# Patient Record
Sex: Female | Born: 1950 | Race: White | Hispanic: No | Marital: Single | Smoking: Former smoker
Health system: Southern US, Community
[De-identification: ages and names within clinical notes are randomized; demographics above are authoritative.]

## PROBLEM LIST (undated history)

## (undated) DIAGNOSIS — Z780 Asymptomatic menopausal state: Secondary | ICD-10-CM

## (undated) DIAGNOSIS — F909 Attention-deficit hyperactivity disorder, unspecified type: Secondary | ICD-10-CM

## (undated) DIAGNOSIS — M419 Scoliosis, unspecified: Secondary | ICD-10-CM

## (undated) DIAGNOSIS — I10 Essential (primary) hypertension: Principal | ICD-10-CM

## (undated) DIAGNOSIS — R6882 Decreased libido: Secondary | ICD-10-CM

## (undated) DIAGNOSIS — F329 Major depressive disorder, single episode, unspecified: Secondary | ICD-10-CM

## (undated) DIAGNOSIS — F32A Depression, unspecified: Secondary | ICD-10-CM

## (undated) DIAGNOSIS — B351 Tinea unguium: Secondary | ICD-10-CM

## (undated) DIAGNOSIS — M1990 Unspecified osteoarthritis, unspecified site: Secondary | ICD-10-CM

## (undated) DIAGNOSIS — F419 Anxiety disorder, unspecified: Secondary | ICD-10-CM

## (undated) HISTORY — DX: Essential (primary) hypertension: I10

## (undated) HISTORY — DX: Unspecified osteoarthritis, unspecified site: M19.90

## (undated) HISTORY — DX: Scoliosis, unspecified: M41.9

## (undated) HISTORY — DX: Decreased libido: R68.82

## (undated) HISTORY — PX: OTHER SURGICAL HISTORY: SHX169

## (undated) HISTORY — DX: Major depressive disorder, single episode, unspecified: F32.9

## (undated) HISTORY — DX: Asymptomatic menopausal state: Z78.0

## (undated) HISTORY — DX: Depression, unspecified: F32.A

## (undated) HISTORY — DX: Tinea unguium: B35.1

## (undated) HISTORY — DX: Anxiety disorder, unspecified: F41.9

## (undated) HISTORY — DX: Attention-deficit hyperactivity disorder, unspecified type: F90.9

---

## 1997-12-03 ENCOUNTER — Other Ambulatory Visit: Admission: RE | Admit: 1997-12-03 | Discharge: 1997-12-03 | Payer: Self-pay | Admitting: Obstetrics and Gynecology

## 1999-01-23 ENCOUNTER — Other Ambulatory Visit: Admission: RE | Admit: 1999-01-23 | Discharge: 1999-01-23 | Payer: Self-pay | Admitting: Obstetrics and Gynecology

## 1999-02-12 ENCOUNTER — Encounter: Admission: RE | Admit: 1999-02-12 | Discharge: 1999-02-12 | Payer: Self-pay | Admitting: Obstetrics and Gynecology

## 1999-02-12 ENCOUNTER — Encounter: Payer: Self-pay | Admitting: Obstetrics and Gynecology

## 2000-02-23 ENCOUNTER — Other Ambulatory Visit: Admission: RE | Admit: 2000-02-23 | Discharge: 2000-02-23 | Payer: Self-pay | Admitting: Obstetrics and Gynecology

## 2000-03-04 ENCOUNTER — Encounter: Admission: RE | Admit: 2000-03-04 | Discharge: 2000-03-04 | Payer: Self-pay | Admitting: Obstetrics and Gynecology

## 2000-03-04 ENCOUNTER — Encounter: Payer: Self-pay | Admitting: Obstetrics and Gynecology

## 2001-03-01 ENCOUNTER — Other Ambulatory Visit: Admission: RE | Admit: 2001-03-01 | Discharge: 2001-03-01 | Payer: Self-pay | Admitting: Obstetrics and Gynecology

## 2001-03-07 ENCOUNTER — Encounter: Admission: RE | Admit: 2001-03-07 | Discharge: 2001-03-07 | Payer: Self-pay | Admitting: Obstetrics and Gynecology

## 2001-03-07 ENCOUNTER — Encounter: Payer: Self-pay | Admitting: Obstetrics and Gynecology

## 2001-03-25 ENCOUNTER — Encounter: Payer: Self-pay | Admitting: Obstetrics and Gynecology

## 2001-03-25 ENCOUNTER — Encounter: Admission: RE | Admit: 2001-03-25 | Discharge: 2001-03-25 | Payer: Self-pay | Admitting: Obstetrics and Gynecology

## 2002-03-13 ENCOUNTER — Other Ambulatory Visit: Admission: RE | Admit: 2002-03-13 | Discharge: 2002-03-13 | Payer: Self-pay | Admitting: Obstetrics and Gynecology

## 2002-03-15 ENCOUNTER — Encounter: Payer: Self-pay | Admitting: Obstetrics and Gynecology

## 2002-03-15 ENCOUNTER — Encounter: Admission: RE | Admit: 2002-03-15 | Discharge: 2002-03-15 | Payer: Self-pay | Admitting: Obstetrics and Gynecology

## 2003-04-17 ENCOUNTER — Other Ambulatory Visit: Admission: RE | Admit: 2003-04-17 | Discharge: 2003-04-17 | Payer: Self-pay | Admitting: Obstetrics and Gynecology

## 2003-07-18 ENCOUNTER — Encounter: Admission: RE | Admit: 2003-07-18 | Discharge: 2003-07-18 | Payer: Self-pay | Admitting: Obstetrics and Gynecology

## 2004-07-21 ENCOUNTER — Encounter: Admission: RE | Admit: 2004-07-21 | Discharge: 2004-07-21 | Payer: Self-pay | Admitting: Obstetrics and Gynecology

## 2005-07-24 ENCOUNTER — Encounter: Admission: RE | Admit: 2005-07-24 | Discharge: 2005-07-24 | Payer: Self-pay | Admitting: Obstetrics and Gynecology

## 2005-10-19 ENCOUNTER — Encounter: Admission: RE | Admit: 2005-10-19 | Discharge: 2005-10-19 | Payer: Self-pay | Admitting: Emergency Medicine

## 2006-07-26 ENCOUNTER — Encounter: Admission: RE | Admit: 2006-07-26 | Discharge: 2006-07-26 | Payer: Self-pay | Admitting: Emergency Medicine

## 2007-08-01 ENCOUNTER — Encounter: Admission: RE | Admit: 2007-08-01 | Discharge: 2007-08-01 | Payer: Self-pay | Admitting: Obstetrics and Gynecology

## 2008-08-14 ENCOUNTER — Encounter: Admission: RE | Admit: 2008-08-14 | Discharge: 2008-08-14 | Payer: Self-pay | Admitting: Obstetrics and Gynecology

## 2009-08-21 ENCOUNTER — Encounter: Admission: RE | Admit: 2009-08-21 | Discharge: 2009-08-21 | Payer: Self-pay | Admitting: Family Medicine

## 2010-06-12 ENCOUNTER — Institutional Professional Consult (permissible substitution) (INDEPENDENT_AMBULATORY_CARE_PROVIDER_SITE_OTHER): Payer: BC Managed Care – PPO | Admitting: Family Medicine

## 2010-06-12 DIAGNOSIS — Z79899 Other long term (current) drug therapy: Secondary | ICD-10-CM

## 2010-06-12 DIAGNOSIS — R109 Unspecified abdominal pain: Secondary | ICD-10-CM

## 2010-06-12 DIAGNOSIS — L219 Seborrheic dermatitis, unspecified: Secondary | ICD-10-CM

## 2010-06-12 DIAGNOSIS — B351 Tinea unguium: Secondary | ICD-10-CM

## 2010-07-17 ENCOUNTER — Ambulatory Visit (INDEPENDENT_AMBULATORY_CARE_PROVIDER_SITE_OTHER): Payer: BC Managed Care – PPO

## 2010-07-17 DIAGNOSIS — Z79899 Other long term (current) drug therapy: Secondary | ICD-10-CM

## 2010-08-05 ENCOUNTER — Other Ambulatory Visit: Payer: Self-pay | Admitting: Family Medicine

## 2010-08-05 DIAGNOSIS — Z1231 Encounter for screening mammogram for malignant neoplasm of breast: Secondary | ICD-10-CM

## 2010-08-25 ENCOUNTER — Ambulatory Visit
Admission: RE | Admit: 2010-08-25 | Discharge: 2010-08-25 | Disposition: A | Discharge status: Home / Self Care | Payer: BC Managed Care – PPO | Source: Ambulatory Visit | Attending: Family Medicine | Admitting: Family Medicine

## 2010-08-25 DIAGNOSIS — Z1231 Encounter for screening mammogram for malignant neoplasm of breast: Secondary | ICD-10-CM

## 2010-08-26 ENCOUNTER — Other Ambulatory Visit: Payer: BC Managed Care – PPO

## 2010-08-27 ENCOUNTER — Other Ambulatory Visit: Payer: BC Managed Care – PPO

## 2010-08-27 DIAGNOSIS — Z79899 Other long term (current) drug therapy: Secondary | ICD-10-CM

## 2010-08-27 LAB — HEPATIC FUNCTION PANEL
ALT: 14 U/L (ref 0–35)
Bilirubin, Direct: 0.1 mg/dL (ref 0.0–0.3)
Total Bilirubin: 0.7 mg/dL (ref 0.3–1.2)
Total Protein: 6.3 g/dL (ref 6.0–8.3)

## 2010-08-29 ENCOUNTER — Encounter: Payer: Self-pay | Admitting: Family Medicine

## 2010-09-06 ENCOUNTER — Other Ambulatory Visit: Payer: Self-pay | Admitting: Family Medicine

## 2010-09-08 ENCOUNTER — Telehealth: Payer: Self-pay | Admitting: *Deleted

## 2010-09-08 NOTE — Telephone Encounter (Signed)
Left message on pt's vm informing her that DrKnapp did not refill her Lamisil as treatment was only for 3 months.

## 2010-09-08 NOTE — Telephone Encounter (Signed)
Please review and refill is needed.

## 2010-09-10 ENCOUNTER — Telehealth: Payer: Self-pay | Admitting: *Deleted

## 2010-09-10 NOTE — Telephone Encounter (Signed)
You can advise the patient that the medication stays in the system (nailbed) longer than for just the time that she takes it.  We can discuss further at her CPE

## 2010-09-10 NOTE — Telephone Encounter (Signed)
Pt advised that medication stays in system longer than just while taking. Will discuss at CPE 09/25/10.

## 2010-09-10 NOTE — Telephone Encounter (Signed)
Patient was returning my phone call from the other day [id number]. Requested refill, I informed her that the 3 month therapy course was completed and there was no reason for refill. Patient called me wanting to to know if she could do another course possibly course. Her new growth appears normal, but she feels that she is only 50% better. She has a CPE sch for 09/25/10.

## 2010-09-18 ENCOUNTER — Encounter: Payer: Self-pay | Admitting: *Deleted

## 2010-09-25 ENCOUNTER — Ambulatory Visit (INDEPENDENT_AMBULATORY_CARE_PROVIDER_SITE_OTHER): Payer: BC Managed Care – PPO | Admitting: Family Medicine

## 2010-09-25 ENCOUNTER — Encounter: Payer: Self-pay | Admitting: Family Medicine

## 2010-09-25 VITALS — BP 142/92 | HR 88 | Ht 65.0 in | Wt 120.5 lb

## 2010-09-25 DIAGNOSIS — Z23 Encounter for immunization: Secondary | ICD-10-CM

## 2010-09-25 DIAGNOSIS — B351 Tinea unguium: Secondary | ICD-10-CM

## 2010-09-25 DIAGNOSIS — R5383 Other fatigue: Secondary | ICD-10-CM

## 2010-09-25 DIAGNOSIS — F411 Generalized anxiety disorder: Secondary | ICD-10-CM

## 2010-09-25 DIAGNOSIS — Z0000 Encounter for general adult medical examination without abnormal findings: Secondary | ICD-10-CM

## 2010-09-25 LAB — CBC WITH DIFFERENTIAL/PLATELET
Basophils Relative: 1 % (ref 0–1)
Eosinophils Absolute: 0.2 10*3/uL (ref 0.0–0.7)
Eosinophils Relative: 4 % (ref 0–5)
HCT: 44 % (ref 36.0–46.0)
Hemoglobin: 15 g/dL (ref 12.0–15.0)
Lymphs Abs: 1.2 10*3/uL (ref 0.7–4.0)
MCH: 33.5 pg (ref 26.0–34.0)
MCHC: 34.1 g/dL (ref 30.0–36.0)
Monocytes Absolute: 0.4 10*3/uL (ref 0.1–1.0)
Monocytes Relative: 10 % (ref 3–12)
Neutro Abs: 2.5 10*3/uL (ref 1.7–7.7)
RDW: 13.1 % (ref 11.5–15.5)

## 2010-09-25 LAB — POCT URINALYSIS DIPSTICK
Ketones, UA: NEGATIVE
Spec Grav, UA: 1.005
pH, UA: 6

## 2010-09-25 LAB — TSH: TSH: 0.865 u[IU]/mL (ref 0.350–4.500)

## 2010-09-25 NOTE — Progress Notes (Signed)
Subjective:    Patient ID: Olivia Harris, female    DOB: [birth year], 60 y.o.   MRN: [id number]  HPI Olivia Harris is a 60 y.o. female who presents for a complete physical. She sees a GYN for her female exams.  She has the following list of concerns: Thinks she needs more Lamisil. She checks her BP's every morning.  The last few days they've been 109-114/70 in both arms.  Has noticed sometimes the BP is higher in her right arm (never higher in the left, but sometimes <10 points different) Pain R anterior chest while in [employer], after working with trainer just prior.  Has had similar symptom after riding bike yesterday (in the 90 degrees) Complains of fatigue, wakes up tired.  Seems to be getting better.  Not known to snore/apnea Lump on palm of R hand by her 4th finger.  No triggering of finger.  Slightly tender to press on, otherwise symptomatic Vaginal sore where she was putting testosterone  There is no immunization history on file for this patient. Last tetanus 4 years ago per pt Last Pap smear: 04/2010 Last mammogram: 07/2010 Last colonoscopy: Never. Has a list of providers that will do procedure for just copay rather than large cost Last DEXA: through GYN, osteopenia per pt Pt states that Vitamin D levels were normal a year or 2 ago Ophtho: regular Dentist: regular Works with trainer 2x/week for 25 minutes  Past Medical History  Diagnosis Date  . ADHD (attention deficit hyperactivity disorder) Dr.Steiner  . Depression Dr.Steiner  . Anxiety Dr.Steiner  . Postmenopausal   . Decreased libido   . Scoliosis   . Onychomycosis   . Seborrheic dermatitis     Past Surgical History  Procedure Date  . Arthroscopic surgery shoulder right, 1994(Dr.Rowan) bone spur removed    History   Social History  . Marital Status: Single    Spouse Name: N/A    Number of Children: N/A  . Years of Education: N/A   Occupational History  . Not on file.   Social History Main Topics   . Smoking status: Current Everyday Smoker -- 0.2 packs/day  . Smokeless tobacco: Not on file  . Alcohol Use: Yes     1-2 drinks per day  . Drug Use: No  . Sexually Active: Not on file   Other Topics Concern  . Not on file   Social History Narrative  . No narrative on file   Family History  Problem Relation Age of Onset  . Heart disease Mother     CHF  . Thyroid disease Mother   . Hypertension Brother    Current outpatient prescriptions:ALPRAZolam (XANAX XR) 2 MG 24 hr tablet, Take 2 mg by mouth every morning.  , Disp: , Rfl: ;  amphetamine-dextroamphetamine (ADDERALL) 30 MG tablet, Take 30 mg by mouth daily.  , Disp: , Rfl: ;  estradiol (VIVELLE-DOT) 0.025 MG/24HR, Place 1 patch onto the skin 2 (two) times a week.  , Disp: , Rfl: ;  progesterone (PROMETRIUM) 100 MG capsule, Take 100 mg by mouth at bedtime.  , Disp: , Rfl:  Testosterone Propionate 2 % OINT, Place 0.5 mLs onto the skin daily.  , Disp: , Rfl: ;  traZODone (DESYREL) 100 MG tablet, Take 50-125 mg by mouth at bedtime.  , Disp: , Rfl: ;  sildenafil (VIAGRA) 25 MG tablet, Take 25 mg by mouth daily as needed.  , Disp: , Rfl:   Allergies  Allergen Reactions  .  Antihistamines, Chlorpheniramine-Type Other (See Comments)    hyperactivity   Review of Systems The patient denies anorexia, fever, weight changes, headaches,  vision changes, decreased hearing, ear pain, sore throat, breast concerns, palpitations, dizziness, syncope, dyspnea on exertion, cough, swelling, nausea, vomiting, diarrhea, constipation, melena, hematochezia, indigestion/heartburn, hematuria, incontinence, dysuria, postmenopausal bleeding, vaginal discharge, odor or itch, joint pains, numbness, tingling, weakness, tremor, suspicious skin lesions,   Occasional abdominal pain in the mornings.  Homeopathic medications help.  Started Acidophilus and "quick digest" and doing better.  See HPI for rest of complaints    Objective:   Physical Exam BP 142/92  Pulse  88  Ht 5\' 5"  (1.651 m)  Wt 120 lb 8 oz (54.658 kg)  BMI 20.05 kg/m2  General Appearance:    Alert, cooperative, no distress, appears stated age  Head:    Normocephalic, without obvious abnormality, atraumatic  Eyes:    PERRL, conjunctiva/corneas clear, EOM's intact, fundi    benign  Ears:    Normal TM's and external ear canals  Nose:   Nares normal, mucosa normal, no drainage or sinus   tenderness  Throat:   Lips, mucosa, and tongue normal; teeth and gums normal  Neck:   Supple, no lymphadenopathy;  thyroid:  no   enlargement/tenderness/nodules; no carotid   bruit or JVD  Back:    Spine nontender,+scoliosis, ROM normal, no CVA     tenderness  Lungs:     Clear to auscultation bilaterally without wheezes, rales or     ronchi; respirations unlabored  Chest Wall:    No tenderness or deformity   Heart:    Regular rate and rhythm, S1 and S2 normal, no murmur, rub   or gallop  Breast Exam:    Deferred to GYN  Abdomen:     Soft, non-tender, nondistended, normoactive bowel sounds,    no masses, no hepatosplenomegaly  Genitalia:   Full exam deferred to GYN.  Small cluster of 5 pustules, erythematous at R labia minora     Extremities:   No clubbing, cyanosis or edema.  Nodule palpable palmar aspect R hand, along 4th flexor tendon, nontender.  There is 3mm of normal nail growth at base of R great toenail  Pulses:   2+ and symmetric all extremities  Skin:   Skin color, texture, turgor normal, no rashes or lesions  Lymph nodes:   Cervical, supraclavicular, and axillary nodes normal  Neurologic:   CNII-XII intact, normal strength, sensation and gait; reflexes 2+ and symmetric throughout          Psych:   Normal hygiene and grooming.  Flat affect, anxious, mildly obsessing over things such as her BP's, and the other minor complaints.  Brought in many lists       Assessment & Plan:   1. Routine general medical examination at a health care facility  POCT urinalysis dipstick, Visual acuity screening,  Lipid panel  2. Need for pneumococcal vaccination  Pneumococcal polysaccharide vaccine 23-valent greater than or equal to 2yo subcutaneous/IM  3. Fatigue  CBC with Differential, TSH, Comprehensive metabolic panel   Onychomycosis-- reassured that nail is growing in normally, and will continue to do so.  May take a year for entire great toe nail to appear normal.  Repeat Lamisil course is not needed at this time.  Labial lesion--possibly related to shaving, vs topical testosterone use.  Recommend warm soaks, antibacterial ointment, no shaving, and f/u with GYN if not healing.  R flexor tendon nodule (without any triggering of finger)  vs ganglion cyst. Asymptomatic, therefore no treatment recommended.  She will let me know if she becomes symptomatic for referral to hand surgeon.  Reassured that her BP's are the same in both arms.  BP elevated here today, likely related to her anxiety.  BP's at home are normal.  Fatigue--check CBC, c-met, lipid, tsh  Pneumovax given due to her being a smoker.  Counseled regarding smoking cessation. Check insurance/cost of Zostavax--advised that it is recommended at age 74 Schedule colonoscopy (she will call us if referral is needed, but she wants to do herself)  Discussed monthly self breast exams and yearly mammograms after the age of 8; at least 30 minutes of aerobic activity at least 5 days/week; proper sunscreen use reviewed; healthy diet, including goals of calcium and vitamin D intake and alcohol recommendations (less than or equal to 1 drink/day) reviewed; regular seatbelt use; changing batteries in smoke detectors.  Immunization recommendations discussed (see above).  Colonoscopy recommendations reviewed--encouraged to schedule now

## 2010-09-26 ENCOUNTER — Encounter: Payer: Self-pay | Admitting: Family Medicine

## 2010-09-26 LAB — LIPID PANEL
Cholesterol: 199 mg/dL (ref 0–200)
HDL: 84 mg/dL (ref >39)
Total CHOL/HDL Ratio: 2.4 Ratio
Triglycerides: 63 mg/dL (ref <150)

## 2010-09-26 LAB — COMPREHENSIVE METABOLIC PANEL
ALT: 13 U/L (ref 0–35)
AST: 18 U/L (ref 0–37)
Albumin: 4.2 g/dL (ref 3.5–5.2)
Alkaline Phosphatase: 70 U/L (ref 39–117)
Creat: 0.55 mg/dL (ref 0.50–1.10)
Total Bilirubin: 0.8 mg/dL (ref 0.3–1.2)
Total Protein: 6.3 g/dL (ref 6.0–8.3)

## 2010-12-31 ENCOUNTER — Ambulatory Visit: Payer: BC Managed Care – PPO | Admitting: Family Medicine

## 2011-08-04 ENCOUNTER — Other Ambulatory Visit: Payer: Self-pay | Admitting: Obstetrics & Gynecology

## 2011-08-04 DIAGNOSIS — Z1231 Encounter for screening mammogram for malignant neoplasm of breast: Secondary | ICD-10-CM

## 2011-08-26 ENCOUNTER — Ambulatory Visit
Admission: RE | Admit: 2011-08-26 | Discharge: 2011-08-26 | Disposition: A | Discharge status: Home / Self Care | Payer: BC Managed Care – PPO | Source: Ambulatory Visit | Attending: Obstetrics & Gynecology | Admitting: Obstetrics & Gynecology

## 2011-08-26 DIAGNOSIS — Z1231 Encounter for screening mammogram for malignant neoplasm of breast: Secondary | ICD-10-CM

## 2012-02-04 ENCOUNTER — Encounter: Payer: Self-pay | Admitting: Family Medicine

## 2012-02-04 ENCOUNTER — Ambulatory Visit (INDEPENDENT_AMBULATORY_CARE_PROVIDER_SITE_OTHER): Payer: BC Managed Care – PPO | Admitting: Family Medicine

## 2012-02-04 VITALS — BP 142/90 | HR 118 | Temp 99.3°F | Ht 64.75 in | Wt 135.0 lb

## 2012-02-04 DIAGNOSIS — M899 Disorder of bone, unspecified: Secondary | ICD-10-CM

## 2012-02-04 DIAGNOSIS — M8580 Other specified disorders of bone density and structure, unspecified site: Secondary | ICD-10-CM

## 2012-02-04 DIAGNOSIS — R030 Elevated blood-pressure reading, without diagnosis of hypertension: Secondary | ICD-10-CM

## 2012-02-04 DIAGNOSIS — Z131 Encounter for screening for diabetes mellitus: Secondary | ICD-10-CM

## 2012-02-04 DIAGNOSIS — Z13220 Encounter for screening for lipoid disorders: Secondary | ICD-10-CM

## 2012-02-04 DIAGNOSIS — IMO0002 Reserved for concepts with insufficient information to code with codable children: Secondary | ICD-10-CM

## 2012-02-04 MED ORDER — CEPHALEXIN 500 MG PO TABS: 500.0000 mg | ORAL_TABLET | Freq: Three times a day (TID) | ORAL | Status: DC

## 2012-02-04 NOTE — Patient Instructions (Addendum)
-We have ordered labs or studies at this visit. It can take up to 1-2 weeks for results and processing. We will contact you with instructions IF your results are abnormal. Normal results will be released to your [facility]. If you have not heard from [place] or can not find your results in [facility] in 2 weeks please contact our office.  -PLEASE SIGN UP FOR MYCHART TODAY   We recommend the following healthy lifestyle measures: - eat a healthy diet consisting of lots of vegetables, fruits, beans, nuts, seeds, healthy meats such as white chicken and fish and whole grains.  - avoid fried foods, fast food, processed foods, sodas, red meet and other fattening foods.  - get a least 150 minutes of aerobic exercise per week.   Follow up in: 2 weeks for nail issues - will get clippings of nails at that time  Please keep log of blood pressures.  Paronychia Paronychia is an inflammatory reaction involving the folds of the skin surrounding the fingernail. This is commonly caused by an infection in the skin around a nail. The most common cause of paronychia is frequent wetting of the hands (as seen with bartenders, food servers, nurses or others who wet their hands). This makes the skin around the fingernail susceptible to infection by bacteria (germs) or fungus. Other predisposing factors are:  Aggressive manicuring.  Nail biting.  Thumb sucking. The most common cause is a staphylococcal (a type of germ) infection, or a fungal (Candida) infection. When caused by a germ, it usually comes on suddenly with redness, swelling, pus and is often painful. It may get under the nail and form an abscess (collection of pus), or form an abscess around the nail. If the nail itself is infected with a fungus, the treatment is usually prolonged and may require oral medicine for up to one year. Your caregiver will determine the length of time treatment is required. The paronychia caused by bacteria (germs) may largely be avoided by  not pulling on hangnails or picking at cuticles. When the infection occurs at the tips of the finger it is called felon. When the cause of paronychia is from the herpes simplex virus (HSV) it is called herpetic whitlow. TREATMENT  When an abscess is present treatment is often incision and drainage. This means that the abscess must be cut open so the pus can get out. When this is done, the following home care instructions should be followed. HOME CARE INSTRUCTIONS   It is important to keep the affected fingers very dry. Rubber or plastic gloves over cotton gloves should be used whenever the hand must be placed in water.  Keep wound clean, dry and dressed as suggested by your caregiver between warm soaks or warm compresses.  Soak in warm salt water for ten to fifteen minutes 2-3 times per day for bacterial infections. Fungal infections are very difficult to treat, so often require treatment for long periods of time.  For bacterial (germ) infections take antibiotics (medicine which kill germs) as directed and finish the prescription, even if the problem appears to be solved before the medicine is gone.  Only take over-the-counter or prescription medicines for pain, discomfort, or fever as directed by your caregiver. SEEK IMMEDIATE MEDICAL CARE IF:  You have redness, swelling, or increasing pain in the wound.  You notice pus coming from the wound.  You have a fever.  You notice a bad smell coming from the wound or dressing. Document Released: 09/16/2000 Document Revised: 06/15/2011 Document Reviewed:  05/18/2008 [employer] Patient Information 2013 [place], [place].

## 2012-02-04 NOTE — Progress Notes (Signed)
Chief Complaint  Patient presents with  . Establish Care    HPI: Olivia Harris is here to establish care.   Has the following concerns today:  Nail infection: -R 4th digit -red and swollen area around nail and nail rough for a long time -sore at times -also has chronic nail problem on R great toe  Elevated BP today: -hx of elevated BP in the past at doctor visits -saw cards and BP always normal there and stress test normal -told did not need medication -checks at home and always normal  Other Providers: -gyn: [facility], Dr. Juliene Pina (follows her for osteopenia, paps, breast exams, mammos), hx osteopenia -psych: Dr. Madaline Guthrie - for anxiety, depression, ADHD  Refused flu.   ROS: See pertinent positives and negatives per HPI.  Past Medical History  Diagnosis Date  . ADHD (attention deficit hyperactivity disorder) Dr.Steiner  . Depression Dr.Steiner  . Anxiety Dr.Steiner  . Postmenopausal   . Decreased libido   . Scoliosis   . Onychomycosis   . Seborrheic dermatitis     Family History  Problem Relation Age of Onset  . Heart disease Mother     CHF  . Thyroid disease Mother   . Hypertension Brother     History   Social History  . Marital Status: Single    Spouse Name: N/A    Number of Children: N/A  . Years of Education: N/A   Social History Main Topics  . Smoking status: Current Every Day Smoker -- 0.2 packs/day  . Smokeless tobacco: None  . Alcohol Use: Yes     1-2 drinks per day  . Drug Use: No  . Sexually Active: None   Other Topics Concern  . None   Social History Narrative  . None    Current outpatient prescriptions:ALPRAZolam (XANAX XR) 2 MG 24 hr tablet, Take 2 mg by mouth every morning.  , Disp: , Rfl: ;  amphetamine-dextroamphetamine (ADDERALL) 30 MG tablet, Take 30 mg by mouth daily.  , Disp: , Rfl: ;  meloxicam (MOBIC) 15 MG tablet, Take 15 mg by mouth daily., Disp: , Rfl: ;  traZODone (DESYREL) 100 MG tablet, Take 100 mg by mouth at  bedtime. , Disp: , Rfl:  Cephalexin 500 MG tablet, Take 1 tablet (500 mg total) by mouth 3 (three) times daily., Disp: 30 tablet, Rfl: 0  EXAM:  Filed Vitals:   02/04/12 1654  BP: 142/90  Pulse:   Temp:     Body mass index is 22.64 kg/(m^2).  GENERAL: vitals reviewed and listed above, alert, oriented, appears well hydrated and in no acute distress  HEENT: atraumatic, conjunttiva clear, no obvious abnormalities on inspection of external nose and ears  NECK: no obvious masses on inspection  LUNGS: clear to auscultation bilaterally, no wheezes, rales or rhonchi, good air movement  CV: HRRR, no peripheral edema  SKIN: R great tow with thinning and fraying of distal nail, R 4th digit with erythema around nail - no abscess, some chipping nad cracking of nail.  MS: moves all extremities without noticeable abnormality  PSYCH: pleasant and cooperative, no obvious depression or anxiety  ASSESSMENT AND PLAN:  Discussed the following assessment and plan:  1. Elevated blood pressure    2. Screening for diabetes mellitus  Hemoglobin A1c  3. Nail bed infection  CMP  4. Paronychia    5. Screening for hypercholesterolemia  Lipid Panel  6. Osteopenia  Vitamin D, 25-hydroxy   -will tx paronychia with abx and soaks,  will follow up in 2 weeks and get nail clippings and send for fungal culture -will recheck BP and follow up home BP log next appointment -We reviewed the PMH, PSH, FH, SH, Meds and Allergies. -We provided refills for any medications we will prescribe as needed. -We addressed current concerns per orders and patient instructions. -We have asked for records for pertinent exams, studies, vaccines and notes from previous providers. -We have advised patient to follow up per instructions below.  -Patient advised to return or notify a doctor immediately if symptoms worsen or persist or new concerns arise.  Patient Instructions  -We have ordered labs or studies at this visit. It  can take up to 1-2 weeks for results and processing. We will contact you with instructions IF your results are abnormal. Normal results will be released to your [facility]. If you have not heard from [place] or can not find your results in [facility] in 2 weeks please contact our office.  -PLEASE SIGN UP FOR MYCHART TODAY   We recommend the following healthy lifestyle measures: - eat a healthy diet consisting of lots of vegetables, fruits, beans, nuts, seeds, healthy meats such as white chicken and fish and whole grains.  - avoid fried foods, fast food, processed foods, sodas, red meet and other fattening foods.  - get a least 150 minutes of aerobic exercise per week.   Follow up in: 2 weeks for nail issues - will get clippings of nails at that time  Please keep log of blood pressures.  Paronychia Paronychia is an inflammatory reaction involving the folds of the skin surrounding the fingernail. This is commonly caused by an infection in the skin around a nail. The most common cause of paronychia is frequent wetting of the hands (as seen with bartenders, food servers, nurses or others who wet their hands). This makes the skin around the fingernail susceptible to infection by bacteria (germs) or fungus. Other predisposing factors are:  Aggressive manicuring.  Nail biting.  Thumb sucking. The most common cause is a staphylococcal (a type of germ) infection, or a fungal (Candida) infection. When caused by a germ, it usually comes on suddenly with redness, swelling, pus and is often painful. It may get under the nail and form an abscess (collection of pus), or form an abscess around the nail. If the nail itself is infected with a fungus, the treatment is usually prolonged and may require oral medicine for up to one year. Your caregiver will determine the length of time treatment is required. The paronychia caused by bacteria (germs) may largely be avoided by not pulling on hangnails or picking at cuticles. When  the infection occurs at the tips of the finger it is called felon. When the cause of paronychia is from the herpes simplex virus (HSV) it is called herpetic whitlow. TREATMENT  When an abscess is present treatment is often incision and drainage. This means that the abscess must be cut open so the pus can get out. When this is done, the following home care instructions should be followed. HOME CARE INSTRUCTIONS   It is important to keep the affected fingers very dry. Rubber or plastic gloves over cotton gloves should be used whenever the hand must be placed in water.  Keep wound clean, dry and dressed as suggested by your caregiver between warm soaks or warm compresses.  Soak in warm salt water for ten to fifteen minutes 2-3 times per day for bacterial infections. Fungal infections are very difficult to treat,  so often require treatment for long periods of time.  For bacterial (germ) infections take antibiotics (medicine which kill germs) as directed and finish the prescription, even if the problem appears to be solved before the medicine is gone.  Only take over-the-counter or prescription medicines for pain, discomfort, or fever as directed by your caregiver. SEEK IMMEDIATE MEDICAL CARE IF:  You have redness, swelling, or increasing pain in the wound.  You notice pus coming from the wound.  You have a fever.  You notice a bad smell coming from the wound or dressing. Document Released: 09/16/2000 Document Revised: 06/15/2011 Document Reviewed: 05/18/2008 [facility] Patient Information 2013 [place], [place], [place].

## 2012-02-05 ENCOUNTER — Telehealth: Payer: Self-pay | Admitting: Family Medicine

## 2012-02-05 LAB — COMPREHENSIVE METABOLIC PANEL
ALT: 21 U/L (ref 0–35)
AST: 23 U/L (ref 0–37)
Albumin: 4.5 g/dL (ref 3.5–5.2)
Alkaline Phosphatase: 73 U/L (ref 39–117)
Calcium: 9.3 mg/dL (ref 8.4–10.5)
Creatinine, Ser: 0.5 mg/dL (ref 0.4–1.2)
Potassium: 3.9 mEq/L (ref 3.5–5.1)
Total Bilirubin: 0.6 mg/dL (ref 0.3–1.2)
Total Protein: 7 g/dL (ref 6.0–8.3)

## 2012-02-05 LAB — VITAMIN D 25 HYDROXY (VIT D DEFICIENCY, FRACTURES): Vit D, 25-Hydroxy: 54 ng/mL (ref 30–89)

## 2012-02-05 LAB — LIPID PANEL
Cholesterol: 216 mg/dL — ABNORMAL HIGH (ref 0–200)
Total CHOL/HDL Ratio: 2

## 2012-02-05 NOTE — Telephone Encounter (Signed)
Labs look good. Cholesterol a little high, but good cholesterol high. Advised regular exercise and healthy diet.

## 2012-02-05 NOTE — Telephone Encounter (Signed)
Called and spoke with pt and pt is aware.  

## 2012-02-23 ENCOUNTER — Ambulatory Visit (INDEPENDENT_AMBULATORY_CARE_PROVIDER_SITE_OTHER): Payer: BC Managed Care – PPO | Admitting: Family Medicine

## 2012-02-23 ENCOUNTER — Encounter: Payer: Self-pay | Admitting: Family Medicine

## 2012-02-23 VITALS — BP 152/90 | HR 115 | Temp 99.1°F | Wt 135.0 lb

## 2012-02-23 DIAGNOSIS — I10 Essential (primary) hypertension: Secondary | ICD-10-CM

## 2012-02-23 DIAGNOSIS — L609 Nail disorder, unspecified: Secondary | ICD-10-CM

## 2012-02-23 MED ORDER — LISINOPRIL 5 MG PO TABS: 5.0000 mg | ORAL_TABLET | Freq: Every day | ORAL | Status: DC

## 2012-02-23 NOTE — Progress Notes (Signed)
Chief Complaint  Patient presents with  . Follow-up    HPI:  Follow up:  Elevated Blood Pressure: -elevated last appt, but pt reported always normal at home and white coat HTN -patient brought home BP log today -FH hypertension -exercises and tries to eat right  Nail Problems: -R 4th digit hand and R great toe -Paronychia R hand tx with abx and soaks last visit -Plan to get nail clipping this visit to see if this is a fungus  Smoking: -quit regular cigarettes -using e-cigs  ROS: See pertinent positives and negatives per HPI.  Past Medical History  Diagnosis Date  . ADHD (attention deficit hyperactivity disorder) Dr.Steiner  . Depression Dr.Steiner  . Anxiety Dr.Steiner  . Postmenopausal   . Decreased libido   . Scoliosis   . Onychomycosis   . Seborrheic dermatitis   . Arthritis   . Scoliosis     degenerative     Family History  Problem Relation Age of Onset  . Heart disease Mother     CHF  . Thyroid disease Mother   . Hypertension Brother   . Arthritis Mother   . Arthritis Sister   . Arthritis      grandmother  . Hyperlipidemia Sister   . Heart disease Mother   . Heart disease      grandmother  . Stroke      grandfather  . Hypertension Brother     History   Social History  . Marital Status: Single    Spouse Name: N/A    Number of Children: N/A  . Years of Education: N/A   Social History Main Topics  . Smoking status: Former Smoker -- 0.2 packs/day  . Smokeless tobacco: None  . Alcohol Use: Yes     Comment: 1-2 drinks per day  . Drug Use: No  . Sexually Active: None   Other Topics Concern  . None   Social History Narrative  . None    Current outpatient prescriptions:ALPRAZolam (XANAX XR) 2 MG 24 hr tablet, Take 2 mg by mouth every morning.  , Disp: , Rfl: ;  amphetamine-dextroamphetamine (ADDERALL) 30 MG tablet, Take 30 mg by mouth daily.  , Disp: , Rfl: ;  meloxicam (MOBIC) 15 MG tablet, Take 15 mg by mouth daily., Disp: , Rfl: ;   traZODone (DESYREL) 100 MG tablet, Take 100 mg by mouth at bedtime. , Disp: , Rfl:  Cephalexin 500 MG tablet, Take 1 tablet (500 mg total) by mouth 3 (three) times daily., Disp: 30 tablet, Rfl: 0;  lisinopril (PRINIVIL,ZESTRIL) 5 MG tablet, Take 1 tablet (5 mg total) by mouth daily., Disp: 30 tablet, Rfl: 2  EXAM:  Filed Vitals:   02/23/12 1524  BP: 152/90  Pulse: 115  Temp: 99.1 F (37.3 C)    There is no height on file to calculate BMI.  GENERAL: vitals reviewed and listed above, alert, oriented, appears well hydrated and in no acute distress  HEENT: atraumatic, conjunttiva clear, no obvious abnormalities on inspection of external nose and ears  NECK: no obvious masses on inspection  LUNGS: clear to auscultation bilaterally, no wheezes, rales or rhonchi, good air movement  CV: HRRR, no peripheral edema  SKIN: nail deformity R great toe and 4th digit R hand - thinned nails from filing  MS: moves all extremities without noticeable abnormality  PSYCH: pleasant and cooperative, no obvious depression or anxiety  ASSESSMENT AND PLAN:  Discussed the following assessment and plan:  1. Nail abnormalities  Culture, fungus  without smear, Culture, fungus without smear  2. Hypertension  lisinopril (PRINIVIL,ZESTRIL) 5 MG tablet   -advised BP treatment - discussed options for tx and starting acei - disucssed risks and benefits -for nail condition, suspect fungus - will obtain culture and if positive will tx, if neg will refer to derm for consideration of other patholgies -Patient advised to return or notify a doctor immediately if symptoms worsen or persist or new concerns arise.  There are no Patient Instructions on file for this visit.   Kriste Basque R.

## 2012-02-23 NOTE — Patient Instructions (Signed)
-  As we discussed, we have prescribed a new medication (LISINOPRIL) for you at this appointment. We discussed the common and serious potential adverse effects of this medication and you can review these and more with the pharmacist when you pick up your medication.  Please follow the instructions for use carefully and notify us immediately if you have any problems taking this medication.  -follow up in 6-8 weeks

## 2012-02-24 ENCOUNTER — Encounter: Payer: Self-pay | Admitting: Family Medicine

## 2012-02-25 NOTE — Telephone Encounter (Signed)
Pls advise.  

## 2012-02-25 NOTE — Telephone Encounter (Signed)
     -----   Message from Clemens Catholic to Terressa Koyanagi, DO sent at 02/24/2012 9:01 PM -----    Why is my triglycerides level so high and what does that mean? Thanks, Olivia Harris   [phone] (home) [phone] (work)  Talked with patient about triglycerides and cholesterol. Advised diet and exercise and recheck fasting next visit. Pt also reluctant to take her BP medication. Advised of risks of untreated htn and advised if she does not take it she should keep home log and bring BP cuff and log to a follow up.

## 2012-03-24 ENCOUNTER — Telehealth: Payer: Self-pay | Admitting: Family Medicine

## 2012-03-24 DIAGNOSIS — B351 Tinea unguium: Secondary | ICD-10-CM

## 2012-03-24 NOTE — Telephone Encounter (Signed)
[phone] (home) [phone] (work) Called and LM to return call so that we can discuss tx options for her Candidal Onychomycosis of the toenail. Advised her to call our office.  Alisha, if she calls back please grab me and I will talk with her briefly  From my research it appears itraconazole may be most effective for this and studies using both pulse (200bid for one wk each month) and continuous (200daily for 3 months) tx show cure. Terbinafine also has been shown to be effective. Will discuss risks/benefits of theses medications and let her decide on treatment.

## 2012-03-25 MED ORDER — ITRACONAZOLE 200 MG PO TABS: 200.0000 mg | ORAL_TABLET | Freq: Two times a day (BID) | ORAL | Status: DC

## 2012-03-25 MED ORDER — TERBINAFINE HCL 250 MG PO TABS: 250.0000 mg | ORAL_TABLET | Freq: Every day | ORAL | Status: DC

## 2012-03-25 NOTE — Telephone Encounter (Signed)
Called and spoke with pt and pt is aware.  Rx sent to [facility] on [address] per patient request.

## 2012-03-25 NOTE — Addendum Note (Signed)
Addended by: Azucena Freed on: 03/25/2012 01:26 PM   Modules accepted: Orders

## 2012-03-25 NOTE — Addendum Note (Signed)
Addended by: Terressa Koyanagi on: 03/25/2012 01:09 PM   Modules accepted: Orders

## 2012-03-25 NOTE — Telephone Encounter (Signed)
Olivia Harris,  Talked to her about options, risks/benefits. Unfortunately realized potential dangerous interaction with her xanax and the itraconazole that I was not aware of when ordering. Please let her know, given this, we should go with the Lamisil. Have sent in Rx for this instead.

## 2012-04-11 ENCOUNTER — Ambulatory Visit: Payer: BC Managed Care – PPO | Admitting: Family Medicine

## 2012-05-09 ENCOUNTER — Ambulatory Visit (INDEPENDENT_AMBULATORY_CARE_PROVIDER_SITE_OTHER): Payer: BC Managed Care – PPO | Admitting: Family Medicine

## 2012-05-09 ENCOUNTER — Encounter: Payer: Self-pay | Admitting: Family Medicine

## 2012-05-09 VITALS — BP 142/62 | HR 107 | Temp 99.5°F | Ht 64.0 in | Wt 127.0 lb

## 2012-05-09 DIAGNOSIS — IMO0002 Reserved for concepts with insufficient information to code with codable children: Secondary | ICD-10-CM

## 2012-05-09 DIAGNOSIS — N898 Other specified noninflammatory disorders of vagina: Secondary | ICD-10-CM

## 2012-05-09 DIAGNOSIS — N939 Abnormal uterine and vaginal bleeding, unspecified: Secondary | ICD-10-CM

## 2012-05-09 DIAGNOSIS — B351 Tinea unguium: Secondary | ICD-10-CM

## 2012-05-09 DIAGNOSIS — I10 Essential (primary) hypertension: Secondary | ICD-10-CM

## 2012-05-09 MED ORDER — MUPIROCIN 2 % EX OINT: TOPICAL_OINTMENT | Freq: Three times a day (TID) | CUTANEOUS | Status: DC

## 2012-05-09 NOTE — Patient Instructions (Addendum)
-  call you gynecologist for an appointment about the vaginal bleeding  -continue nail treatments  -keep fingers clean and dry - do warm soaks in antibacterial soapy water for 10 minutes twice dailly, use topical antibiotic ointment (mupirocin) twice daily for 1 week  -continue to keep log of blood sugars  -follow up in 3-4 months

## 2012-05-09 NOTE — Progress Notes (Signed)
Chief Complaint  Patient presents with  . Follow-up    fungal infection in toe nails and finger nails    HPI:  Follow up HTN: -started lisinopril last visit - but pt didn't want to take it per pt email -doing well, reports BP at home 116-138/80s -denies: CP, SOB, palpitations, swelling  Nail infection: -tx with Lamisil after discussion of risks/benefits -occ nail infection on R 4th finger  Vaginal bleeding: -occ for last several months with vaginal stimulation -no pain, weight loss, fevers -she will see her gynecologist for this  ROS: See pertinent positives and negatives per HPI.  Past Medical History  Diagnosis Date  . ADHD (attention deficit hyperactivity disorder) Dr.Steiner  . Depression Dr.Steiner  . Anxiety Dr.Steiner  . Postmenopausal   . Decreased libido   . Scoliosis   . Onychomycosis   . Seborrheic dermatitis   . Arthritis   . Scoliosis     degenerative     Family History  Problem Relation Age of Onset  . Heart disease Mother     CHF  . Thyroid disease Mother   . Hypertension Brother   . Arthritis Mother   . Arthritis Sister   . Arthritis      grandmother  . Hyperlipidemia Sister   . Heart disease Mother   . Heart disease      grandmother  . Stroke      grandfather  . Hypertension Brother     History   Social History  . Marital Status: Single    Spouse Name: N/A    Number of Children: N/A  . Years of Education: N/A   Social History Main Topics  . Smoking status: Former Smoker -- 0.2 packs/day  . Smokeless tobacco: None  . Alcohol Use: Yes     Comment: 1-2 drinks per day  . Drug Use: No  . Sexually Active: None   Other Topics Concern  . None   Social History Narrative  . None    Current outpatient prescriptions:ALPRAZolam (XANAX XR) 2 MG 24 hr tablet, Take 2 mg by mouth every morning.  , Disp: , Rfl: ;  amphetamine-dextroamphetamine (ADDERALL) 30 MG tablet, Take 30 mg by mouth daily.  , Disp: , Rfl: ;  lisinopril  (PRINIVIL,ZESTRIL) 5 MG tablet, Take 1 tablet (5 mg total) by mouth daily., Disp: 30 tablet, Rfl: 2;  terbinafine (LAMISIL) 250 MG tablet, Take 1 tablet (250 mg total) by mouth daily., Disp: 90 tablet, Rfl: 0 traZODone (DESYREL) 100 MG tablet, Take 100 mg by mouth at bedtime. , Disp: , Rfl: ;  Cephalexin 500 MG tablet, Take 1 tablet (500 mg total) by mouth 3 (three) times daily., Disp: 30 tablet, Rfl: 0;  meloxicam (MOBIC) 15 MG tablet, Take 15 mg by mouth daily., Disp: , Rfl: ;  mupirocin ointment (BACTROBAN) 2 %, Apply topically 3 (three) times daily., Disp: 22 g, Rfl: 0  EXAM:  Filed Vitals:   05/09/12 1623  BP: 142/62  Pulse: 107  Temp: 99.5 F (37.5 C)    Body mass index is 21.80 kg/(m^2).  GENERAL: vitals reviewed and listed above, alert, oriented, appears well hydrated and in no acute distress  HEENT: atraumatic, conjunttiva clear, no obvious abnormalities on inspection of external nose and ears  NECK: no obvious masses on inspection  LUNGS: clear to auscultation bilaterally, no wheezes, rales or rhonchi, good air movement  CV: HRRR, no peripheral edema  SKIN: nails improving with new nail healthy - mild paronychia R 4th digit  MS: moves all extremities without noticeable abnormality  PSYCH: pleasant and cooperative, no obvious depression or anxiety  ASSESSMENT AND PLAN:  Discussed the following assessment and plan:  1. Paronychia  mupirocin ointment (BACTROBAN) 2 %  2. Hypertension    3. Onychomycosis    4. Vaginal bleeding     -instructions per below - strongly urged to see her gynecologist within the next few weeks regarding her bleeding, likely vaginal atrophy - but needs eval -pt wants to stop BP medication - advised of risks - she will monitor bp if she stops it -tx for nails per below and continue lamisil to complete course for toenail onychomycosis -Patient advised to return or notify a doctor immediately if symptoms worsen or persist or new concerns  arise.  Patient Instructions  -call you gynecologist for an appointment about the vaginal bleeding  -continue nail treatments  -keep fingers clean and dry - do warm soaks in antibacterial soapy water for 10 minutes twice dailly, use topical antibiotic ointment (mupirocin) twice daily for 1 week  -continue to keep log of blood sugars  -follow up in 3-4 months     Tatelyn Vanhecke R.

## 2012-05-12 ENCOUNTER — Telehealth: Payer: Self-pay

## 2012-05-12 NOTE — Telephone Encounter (Signed)
Pt called and states that Dr. Selena Batten called in an ointment for pt to [facility] at [facility].  Pt states the rx should have been sent to [facility] and pt was told in order for her to pick it up she needed to get in contact with [facility].  Called [facility] and spoke with [place] and she states the bacterban ointment was recived on the 05/10/12 and picked up the same day. Called and left a message for pt to return call to make sure ointment was received.

## 2012-05-14 ENCOUNTER — Other Ambulatory Visit: Payer: Self-pay | Admitting: Family Medicine

## 2012-05-21 ENCOUNTER — Other Ambulatory Visit: Payer: Self-pay

## 2012-07-29 ENCOUNTER — Encounter: Payer: Self-pay | Admitting: Family Medicine

## 2012-07-29 DIAGNOSIS — I10 Essential (primary) hypertension: Secondary | ICD-10-CM

## 2012-07-29 DIAGNOSIS — M8580 Other specified disorders of bone density and structure, unspecified site: Secondary | ICD-10-CM | POA: Insufficient documentation

## 2012-07-29 DIAGNOSIS — N939 Abnormal uterine and vaginal bleeding, unspecified: Secondary | ICD-10-CM

## 2012-07-29 HISTORY — DX: Essential (primary) hypertension: I10

## 2012-07-29 NOTE — Progress Notes (Signed)
Received dexa report from 07/25/12. FRAX Hip 0.9%, Major 8.0%. recs for Ca, Vit D and weight bearing exercise. Repeat in 2 years. Report scanned in.

## 2012-08-12 ENCOUNTER — Other Ambulatory Visit: Payer: Self-pay | Admitting: Family Medicine

## 2012-08-12 ENCOUNTER — Other Ambulatory Visit: Payer: Self-pay

## 2012-08-12 DIAGNOSIS — Z1231 Encounter for screening mammogram for malignant neoplasm of breast: Secondary | ICD-10-CM

## 2012-08-12 NOTE — Telephone Encounter (Signed)
Called and spoke with pt and pt has an upcoming appt for 08/22/12. Pt went to gyn appt and was told it was nothing to worry about.

## 2012-08-12 NOTE — Telephone Encounter (Signed)
Refill for 2 months, make sure has follow up appt in may or June. Also, check to see she has seen gyn for her vaginal bleeding? Thanks.

## 2012-08-12 NOTE — Telephone Encounter (Signed)
Left a message for return call.  

## 2012-08-22 ENCOUNTER — Encounter: Payer: Self-pay | Admitting: Family Medicine

## 2012-08-22 ENCOUNTER — Ambulatory Visit (INDEPENDENT_AMBULATORY_CARE_PROVIDER_SITE_OTHER): Payer: BC Managed Care – PPO | Admitting: Family Medicine

## 2012-08-22 VITALS — BP 136/82 | HR 82 | Temp 98.6°F | Wt 122.0 lb

## 2012-08-22 DIAGNOSIS — F329 Major depressive disorder, single episode, unspecified: Secondary | ICD-10-CM

## 2012-08-22 DIAGNOSIS — I10 Essential (primary) hypertension: Secondary | ICD-10-CM

## 2012-08-22 DIAGNOSIS — L989 Disorder of the skin and subcutaneous tissue, unspecified: Secondary | ICD-10-CM

## 2012-08-22 DIAGNOSIS — B351 Tinea unguium: Secondary | ICD-10-CM

## 2012-08-22 DIAGNOSIS — F341 Dysthymic disorder: Secondary | ICD-10-CM

## 2012-08-22 MED ORDER — LISINOPRIL 5 MG PO TABS: 5.0000 mg | ORAL_TABLET | Freq: Every day | ORAL | Status: DC

## 2012-08-22 NOTE — Patient Instructions (Signed)
-  continue lisinopril  -see dermatologist about the lesion on your buttock  -schedule your colonoscopy  -check on cost of shingles vaccine  -follow up in 6-12 months

## 2012-08-22 NOTE — Progress Notes (Signed)
No chief complaint on file.   HPI:  Follow up:  HTN: -started on lisinopril, is taking daily -home BP ok -tolerating medication well -exercise on a regular basis -denies: SOB, CP, swelling  Onychomycosis: -treated with lamisil -doing great  Pscyh: -followed by psych, tapering of benzos  Lesion on R buttock: -started a few months ago -almost a wound, now improved but still red -does not hurt or itch -hx of abnormal mole  ROS: See pertinent positives and negatives per HPI.  Past Medical History  Diagnosis Date  . ADHD (attention deficit hyperactivity disorder) Dr.Steiner  . Depression Dr.Steiner  . Anxiety Dr.Steiner  . Postmenopausal   . Decreased libido   . Scoliosis   . Onychomycosis   . Seborrheic dermatitis   . Arthritis   . Scoliosis     degenerative   . Essential hypertension, benign  07/29/2012    Family History  Problem Relation Age of Onset  . Thyroid disease Mother   . Hypertension Brother   . Arthritis Mother   . Arthritis Sister   . Arthritis      grandmother  . Hyperlipidemia Sister   . Heart disease Mother     CHF  . Heart disease      grandmother  . Stroke      grandfather  . Hypertension Brother     History   Social History  . Marital Status: Single    Spouse Name: N/A    Number of Children: N/A  . Years of Education: N/A   Social History Main Topics  . Smoking status: Former Smoker -- 0.20 packs/day  . Smokeless tobacco: None  . Alcohol Use: Yes     Comment: 1-2 drinks per day  . Drug Use: No  . Sexually Active: None   Other Topics Concern  . None   Social History Narrative  . None    Current outpatient prescriptions:amphetamine-dextroamphetamine (ADDERALL) 30 MG tablet, Take 30 mg by mouth daily.  , Disp: , Rfl: ;  lisinopril (PRINIVIL,ZESTRIL) 5 MG tablet, Take 1 tablet (5 mg total) by mouth daily., Disp: 90 tablet, Rfl: 3;  meloxicam (MOBIC) 15 MG tablet, Take 15 mg by mouth daily., Disp: , Rfl: ;  mupirocin  ointment (BACTROBAN) 2 %, Apply topically 3 (three) times daily., Disp: 22 g, Rfl: 0 Testosterone POWD, , Disp: , Rfl: ;  traZODone (DESYREL) 100 MG tablet, Take 50 mg by mouth at bedtime. , Disp: , Rfl: ;  ALPRAZolam (XANAX XR) 2 MG 24 hr tablet, Take 2 mg by mouth every morning.  , Disp: , Rfl: ;  PRISTIQ 50 MG 24 hr tablet, Take 50 mg by mouth daily. , Disp: , Rfl: ;  terbinafine (LAMISIL) 250 MG tablet, Take 1 tablet (250 mg total) by mouth daily., Disp: 90 tablet, Rfl: 0  EXAM:  Filed Vitals:   08/22/12 1619  BP: 136/82  Pulse: 82  Temp: 98.6 F (37 C)    Body mass index is 20.93 kg/(m^2).  GENERAL: vitals reviewed and listed above, alert, oriented, appears well hydrated and in no acute distress  HEENT: atraumatic, conjunttiva clear, no obvious abnormalities on inspection of external nose and ears  NECK: no obvious masses on inspection  LUNGS: clear to auscultation bilaterally, no wheezes, rales or rhonchi, good air movement  CV: HRRR, no peripheral edema  MS: moves all extremities without noticeable abnormality  SKIN: erythematous, irregular red macule on R buttock, nails look good  PSYCH: pleasant and cooperative, no obvious depression  or anxiety  ASSESSMENT AND PLAN:  Discussed the following assessment and plan:  Essential hypertension, benign -  - Plan: lisinopril (PRINIVIL,ZESTRIL) 5 MG tablet -she restarted lisinopril and is doing well, advised to continue  Onychomycosis -resolved  Anxiety and depression -stable  Skin Lesion: -unsure of etiology, she will schedule appt with derm for check in next few weeks - she will call if needs referral  -Patient advised to return or notify a doctor immediately if symptoms worsen or persist or new concerns arise.  Patient Instructions  -continue lisinopril  -see dermatologist about the lesion on your buttock  -schedule your colonoscopy  -check on cost of shingles vaccine  -follow up in 6-12 months     Tycen Dockter,  Sahej Hauswirth R.

## 2012-08-25 ENCOUNTER — Telehealth: Payer: Self-pay | Admitting: Family Medicine

## 2012-08-25 NOTE — Telephone Encounter (Signed)
Pt to inform you  Mg of PRISTIQ  Is  50mg 

## 2012-09-12 ENCOUNTER — Ambulatory Visit
Admission: RE | Admit: 2012-09-12 | Discharge: 2012-09-12 | Disposition: A | Discharge status: Home / Self Care | Payer: BC Managed Care – PPO | Source: Ambulatory Visit

## 2012-09-12 DIAGNOSIS — Z1231 Encounter for screening mammogram for malignant neoplasm of breast: Secondary | ICD-10-CM

## 2012-09-19 ENCOUNTER — Ambulatory Visit: Payer: BC Managed Care – PPO

## 2013-02-09 ENCOUNTER — Other Ambulatory Visit: Payer: Self-pay

## 2013-09-11 ENCOUNTER — Other Ambulatory Visit: Payer: Self-pay

## 2013-09-11 DIAGNOSIS — Z1231 Encounter for screening mammogram for malignant neoplasm of breast: Secondary | ICD-10-CM

## 2013-09-25 ENCOUNTER — Ambulatory Visit
Admission: RE | Admit: 2013-09-25 | Discharge: 2013-09-25 | Disposition: A | Discharge status: Home / Self Care | Payer: BC Managed Care – PPO | Source: Ambulatory Visit

## 2013-09-25 ENCOUNTER — Ambulatory Visit: Payer: BC Managed Care – PPO

## 2013-09-25 DIAGNOSIS — Z1231 Encounter for screening mammogram for malignant neoplasm of breast: Secondary | ICD-10-CM

## 2013-09-28 ENCOUNTER — Other Ambulatory Visit: Payer: Self-pay | Admitting: Family Medicine

## 2013-10-08 ENCOUNTER — Other Ambulatory Visit: Payer: Self-pay | Admitting: Family Medicine

## 2013-11-29 ENCOUNTER — Other Ambulatory Visit: Payer: Self-pay | Admitting: Family Medicine

## 2014-01-08 ENCOUNTER — Other Ambulatory Visit: Payer: Self-pay | Admitting: Family Medicine

## 2014-01-15 ENCOUNTER — Telehealth: Payer: Self-pay | Admitting: Family Medicine

## 2014-01-15 MED ORDER — LISINOPRIL 5 MG PO TABS: 5.0000 mg | ORAL_TABLET | Freq: Every day | ORAL | Status: DC

## 2014-01-15 NOTE — Telephone Encounter (Signed)
Rx done and I left a message at the pts cell number this was sent to her pharmacy.

## 2014-01-15 NOTE — Telephone Encounter (Signed)
Ok to refill to appt.

## 2014-01-15 NOTE — Telephone Encounter (Signed)
Pt has appt on 02/13/14 and needs refill on lisinopril sent to [facility] [address]. Pt is going to [place] and does not want to come in an be exposure to sick pt prior to going on her trip to [place].

## 2014-02-05 ENCOUNTER — Encounter: Payer: Self-pay | Admitting: Family Medicine

## 2014-02-13 ENCOUNTER — Encounter: Payer: Self-pay | Admitting: Family Medicine

## 2014-02-13 ENCOUNTER — Ambulatory Visit (INDEPENDENT_AMBULATORY_CARE_PROVIDER_SITE_OTHER): Payer: BC Managed Care – PPO | Admitting: Family Medicine

## 2014-02-13 VITALS — BP 128/84 | HR 116 | Temp 98.7°F | Ht 64.0 in | Wt 128.8 lb

## 2014-02-13 DIAGNOSIS — I10 Essential (primary) hypertension: Secondary | ICD-10-CM

## 2014-02-13 DIAGNOSIS — Z131 Encounter for screening for diabetes mellitus: Secondary | ICD-10-CM

## 2014-02-13 DIAGNOSIS — L03011 Cellulitis of right finger: Secondary | ICD-10-CM

## 2014-02-13 DIAGNOSIS — Z13220 Encounter for screening for lipoid disorders: Secondary | ICD-10-CM

## 2014-02-13 MED ORDER — MUPIROCIN 2 % EX OINT: TOPICAL_OINTMENT | Freq: Three times a day (TID) | CUTANEOUS | Status: DC

## 2014-02-13 MED ORDER — LISINOPRIL 5 MG PO TABS: 5.0000 mg | ORAL_TABLET | Freq: Every day | ORAL | Status: DC

## 2014-02-13 NOTE — Progress Notes (Signed)
Pre visit review using our clinic review tool, if applicable. No additional management support is needed unless otherwise documented below in the visit note. 

## 2014-02-13 NOTE — Progress Notes (Signed)
HPI:  Follow up:  HTN: -started on lisinopril, is taking daily -home BP ok -tolerating medication well -exercise on a regular basis -denies: SOB, CP, swelling  ADHD/Depression/Anxiety: -managed by psych (Dr. Madaline GuthrieSteiner) -meds: trazadone, adderall -we have discussed risks/interaction potential with her medications and advised her to work with her psychiatrist to simplify her medication regimen  Paronychia: -R 4th digit hand -had this a long time ago and fungal infection and resolved, but recurred recently  ROS: See pertinent positives and negatives per HPI.  Past Medical History  Diagnosis Date  . ADHD (attention deficit hyperactivity disorder) Dr.Steiner  . Depression Dr.Steiner  . Anxiety Dr.Steiner  . Postmenopausal   . Decreased libido   . Scoliosis   . Onychomycosis   . Seborrheic dermatitis   . Arthritis   . Scoliosis     degenerative   . Essential hypertension, benign  07/29/2012    Past Surgical History  Procedure Laterality Date  . Arthroscopic surgery shoulder  right, 1994(Dr.Rowan) bone spur removed    Family History  Problem Relation Age of Onset  . Thyroid disease Mother   . Hypertension Brother   . Arthritis Mother   . Arthritis Sister   . Arthritis      grandmother  . Hyperlipidemia Sister   . Heart disease Mother     CHF  . Heart disease      grandmother  . Stroke      grandfather  . Hypertension Brother     History   Social History  . Marital Status: Single    Spouse Name: N/A    Number of Children: N/A  . Years of Education: N/A   Social History Main Topics  . Smoking status: Former Smoker -- 0.20 packs/day  . Smokeless tobacco: None  . Alcohol Use: Yes     Comment: 1-2 drinks per day  . Drug Use: No  . Sexual Activity: None   Other Topics Concern  . None   Social History Narrative    Current outpatient prescriptions: amphetamine-dextroamphetamine (ADDERALL) 30 MG tablet, Take 30 mg by mouth daily.  , Disp: , Rfl: ;   lisinopril (PRINIVIL,ZESTRIL) 5 MG tablet, Take 1 tablet (5 mg total) by mouth daily., Disp: 90 tablet, Rfl: 3;  mupirocin ointment (BACTROBAN) 2 %, Apply topically 3 (three) times daily., Disp: 22 g, Rfl: 0;  traZODone (DESYREL) 100 MG tablet, Take 50 mg by mouth at bedtime. , Disp: , Rfl:   EXAM:  Filed Vitals:   02/13/14 1527  BP: 128/84  Pulse: 116  Temp: 98.7 F (37.1 C)    Body mass index is 22.1 kg/(m^2).  GENERAL: vitals reviewed and listed above, alert, oriented, appears well hydrated and in no acute distress  HEENT: atraumatic, conjunttiva clear, no obvious abnormalities on inspection of external nose and ears  NECK: no obvious masses on inspection  LUNGS: clear to auscultation bilaterally, no wheezes, rales or rhonchi, good air movement  CV: HRRR, no peripheral edema  SKIN: -paronychia R 4th digit and deformity of nail  MS: moves all extremities without noticeable abnormality  PSYCH: pleasant and cooperative, no obvious depression or anxiety  ASSESSMENT AND PLAN:  Discussed the following assessment and plan:  Paronychia, right - Plan: mupirocin ointment (BACTROBAN) 2 %  Essential hypertension, benign  - Plan: lisinopril (PRINIVIL,ZESTRIL) 5 MG tablet, Basic metabolic panel  Screening cholesterol level - Plan: Lipid Panel  Screening for diabetes mellitus - Plan: Hemoglobin A1c  -BMP -healthy diet and regular exercise advised -mupirocin  for paronychia but discussed concern given recurrence, she reports complete resolution of symptoms between episodes, rare causes of nail deformity including cancer - she is going to see dermatologist for this - she wants to call to schedule -follow up for physical exam, she wants to do labs before so standing orders placed -Patient advised to return or notify a doctor immediately if symptoms worsen or persist or new concerns arise.  Patient Instructions  BEFORE YOU LEAVE: -labs -schedule CPE in 3 months  Ointment twice  daily for finger - schedule appointment with dermatologist to evaluated this and the other finger issue        KIM, HANNAH R.

## 2014-02-13 NOTE — Patient Instructions (Addendum)
BEFORE YOU LEAVE: -labs -schedule CPE in 3 months  Ointment twice daily for finger - schedule appointment with dermatologist to evaluated this and the other finger issue

## 2014-02-14 ENCOUNTER — Telehealth: Payer: Self-pay | Admitting: Family Medicine

## 2014-02-14 LAB — BASIC METABOLIC PANEL
BUN: 11 mg/dL (ref 6–23)
CO2: 29 mEq/L (ref 19–32)
Calcium: 9.6 mg/dL (ref 8.4–10.5)
Chloride: 99 mEq/L (ref 96–112)
Creatinine, Ser: 0.6 mg/dL (ref 0.4–1.2)
GFR: 113.96 mL/min (ref >60.00)
Glucose, Bld: 94 mg/dL (ref 70–99)
Potassium: 3.7 mEq/L (ref 3.5–5.1)
Sodium: 137 mEq/L (ref 135–145)

## 2014-02-14 NOTE — Telephone Encounter (Signed)
emmi emailed °

## 2015-02-02 ENCOUNTER — Other Ambulatory Visit: Payer: Self-pay | Admitting: Family Medicine

## 2015-03-04 ENCOUNTER — Telehealth: Payer: Self-pay | Admitting: Family Medicine

## 2015-03-04 MED ORDER — LISINOPRIL 5 MG PO TABS: 5.0000 mg | ORAL_TABLET | Freq: Every day | ORAL | Status: DC

## 2015-03-04 NOTE — Telephone Encounter (Signed)
I called the pt and left a detailed message a refill was sent to her pharmacy as she has an appt scheduled for 12/13.

## 2015-03-04 NOTE — Telephone Encounter (Signed)
Ms. Olivia Harris called saying she needs a refill of Lisinopril and thought she needed an appt before receiving the medication. If she does not need an appt, please give her a call and let her know.  Pt ph# [phone] Thank you.

## 2015-03-19 ENCOUNTER — Ambulatory Visit (INDEPENDENT_AMBULATORY_CARE_PROVIDER_SITE_OTHER): Payer: BLUE CROSS/BLUE SHIELD | Admitting: Family Medicine

## 2015-03-19 ENCOUNTER — Encounter: Payer: Self-pay | Admitting: Family Medicine

## 2015-03-19 VITALS — BP 160/100 | HR 113 | Temp 98.9°F | Ht 64.0 in | Wt 126.2 lb

## 2015-03-19 DIAGNOSIS — I10 Essential (primary) hypertension: Secondary | ICD-10-CM | POA: Diagnosis not present

## 2015-03-19 DIAGNOSIS — L03011 Cellulitis of right finger: Secondary | ICD-10-CM | POA: Diagnosis not present

## 2015-03-19 MED ORDER — MUPIROCIN 2 % EX OINT: TOPICAL_OINTMENT | Freq: Every day | CUTANEOUS | Status: DC

## 2015-03-19 MED ORDER — LISINOPRIL 5 MG PO TABS: 5.0000 mg | ORAL_TABLET | Freq: Every day | ORAL | Status: DC

## 2015-03-19 NOTE — Progress Notes (Signed)
HPI:  BP follow up: -not seen in a long time -she is agitated today and anxious and BP up -reports normal usually -wants refill on medication  Nail deformity/Paronychia: -seen for paronychia > 1 year ago tx with soaks and mupirocin - resolved but she wants refil top abx for preventing further paronychia after being in hot tub -on the same visit she mentioned chronic nail deformity on a different finger which has resolved, but she is upset because in listing differential for persistent nail deformity I had included rare causes including cancer - she saw a dermatologist for this and by the time she saw derm had a bump and was dx with mucoid cyst - no tx done -she wants to see another provider as she feels I should not have listed ca on the differential for this -she also wants refill on top abx because she feels if prevents the mucus cyst?  ROS: See pertinent positives and negatives per HPI.  Past Medical History  Diagnosis Date  . ADHD (attention deficit hyperactivity disorder) Dr.Steiner  . Depression Dr.Steiner  . Anxiety Dr.Steiner  . Postmenopausal   . Decreased libido   . Scoliosis   . Onychomycosis   . Seborrheic dermatitis   . Arthritis   . Scoliosis     degenerative   . Essential hypertension, benign  07/29/2012    Past Surgical History  Procedure Laterality Date  . Arthroscopic surgery shoulder  right, 1994(Dr.Rowan) bone spur removed    Family History  Problem Relation Age of Onset  . Thyroid disease Mother   . Hypertension Brother   . Arthritis Mother   . Arthritis Sister   . Arthritis      grandmother  . Hyperlipidemia Sister   . Heart disease Mother     CHF  . Heart disease      grandmother  . Stroke      grandfather  . Hypertension Brother     Social History   Social History  . Marital Status: Single    Spouse Name: N/A  . Number of Children: N/A  . Years of Education: N/A   Social History Main Topics  . Smoking status: Former Smoker --  0.20 packs/day  . Smokeless tobacco: None  . Alcohol Use: Yes     Comment: 1-2 drinks per day  . Drug Use: No  . Sexual Activity: Not Asked   Other Topics Concern  . None   Social History Narrative     Current outpatient prescriptions:  .  amphetamine-dextroamphetamine (ADDERALL) 30 MG tablet, Take 30 mg by mouth daily.  , Disp: , Rfl:  .  lisinopril (PRINIVIL,ZESTRIL) 5 MG tablet, Take 1 tablet (5 mg total) by mouth daily., Disp: 30 tablet, Rfl: 0 .  mupirocin ointment (BACTROBAN) 2 %, Apply topically daily., Disp: 15 g, Rfl: 0 .  traZODone (DESYREL) 100 MG tablet, Take 50 mg by mouth at bedtime. , Disp: , Rfl:   EXAM:  Filed Vitals:   03/19/15 1633 03/19/15 1637  BP: 144/102 160/100  Pulse: 113   Temp: 98.9 F (37.2 C)     Body mass index is 21.65 kg/(m^2).  GENERAL: vitals reviewed and listed above, alert, oriented, appears well hydrated and in no acute distress  HEENT: atraumatic, conjunttiva clear, no obvious abnormalities on inspection of external nose and ears  NECK: no obvious masses on inspection  LUNGS: clear to auscultation bilaterally, no wheezes, rales or rhonchi, good air movement  CV: HRRR, no peripheral edema  SKIN  and nails: on hands normal  MS: moves all extremities without noticeable abnormality  PSYCH: pleasant and cooperative, no obvious depression or anxiety  ASSESSMENT AND PLAN:  Discussed the following assessment and plan:  Essential hypertension, benign  -bp much better on recheck 142/86 -refilled medication, she is convince is elevated due to being upset -advised follow up in 3 month with me or another provider (gave her names of providers taking new patients here)  Paronychia, right - Plan: mupirocin ointment (BACTROBAN) 2 % -refilled though advised I am not aware of any data that suggest prophylactic use for paronychia or digital mucus cyst -apologized she was upset that I listed differential for nail deformity, but explained that  listing differential is part of providing good care   -Patient advised to return or notify a doctor immediately if symptoms worsen or persist or new concerns arise.  Patient Instructions  BEFORE YOU LEAVE: -labs  Follow up in 3 months with me or another provider to recheck your blood pressure     Olivia Harris, Olivia R.

## 2015-03-19 NOTE — Progress Notes (Signed)
Pre visit review using our clinic review tool, if applicable. No additional management support is needed unless otherwise documented below in the visit note. 

## 2015-03-19 NOTE — Patient Instructions (Signed)
BEFORE YOU LEAVE: -labs  Follow up in 3 months with me or another provider to recheck your blood pressure

## 2015-03-20 LAB — BASIC METABOLIC PANEL
BUN: 8 mg/dL (ref 6–23)
CALCIUM: 10.5 mg/dL (ref 8.4–10.5)
CHLORIDE: 97 meq/L (ref 96–112)
CO2: 29 meq/L (ref 19–32)
CREATININE: 0.62 mg/dL (ref 0.40–1.20)
GFR: 103.06 mL/min (ref >60.00)
GLUCOSE: 93 mg/dL (ref 70–99)
Potassium: 5.2 mEq/L — ABNORMAL HIGH (ref 3.5–5.1)
Sodium: 137 mEq/L (ref 135–145)

## 2015-05-20 ENCOUNTER — Telehealth: Payer: Self-pay | Admitting: Family Medicine

## 2015-05-20 MED ORDER — LISINOPRIL 5 MG PO TABS: 5.0000 mg | ORAL_TABLET | Freq: Every day | ORAL | Status: DC

## 2015-05-20 NOTE — Telephone Encounter (Signed)
Pt has found a new md and can not see md until 06-18-15. Pt would like another refill on lisinopril 5 mg sent to [facility] [address]

## 2015-05-20 NOTE — Telephone Encounter (Signed)
Rx done. 

## 2015-08-08 ENCOUNTER — Other Ambulatory Visit: Payer: Self-pay | Admitting: Family Medicine

## 2016-12-03 ENCOUNTER — Encounter (HOSPITAL_COMMUNITY): Payer: Self-pay | Admitting: Nurse Practitioner

## 2016-12-03 ENCOUNTER — Emergency Department (HOSPITAL_COMMUNITY)
Admission: EM | Admit: 2016-12-03 | Discharge: 2016-12-04 | Disposition: A | Discharge status: Home / Self Care | Payer: Medicare Other | Attending: Emergency Medicine | Admitting: Emergency Medicine

## 2016-12-03 DIAGNOSIS — F321 Major depressive disorder, single episode, moderate: Secondary | ICD-10-CM | POA: Insufficient documentation

## 2016-12-03 DIAGNOSIS — F322 Major depressive disorder, single episode, severe without psychotic features: Secondary | ICD-10-CM | POA: Diagnosis present

## 2016-12-03 DIAGNOSIS — F329 Major depressive disorder, single episode, unspecified: Secondary | ICD-10-CM | POA: Diagnosis present

## 2016-12-03 DIAGNOSIS — I10 Essential (primary) hypertension: Secondary | ICD-10-CM | POA: Insufficient documentation

## 2016-12-03 DIAGNOSIS — Z87891 Personal history of nicotine dependence: Secondary | ICD-10-CM | POA: Diagnosis not present

## 2016-12-03 DIAGNOSIS — R45851 Suicidal ideations: Secondary | ICD-10-CM | POA: Insufficient documentation

## 2016-12-03 DIAGNOSIS — F909 Attention-deficit hyperactivity disorder, unspecified type: Secondary | ICD-10-CM | POA: Insufficient documentation

## 2016-12-03 LAB — CBC
HEMATOCRIT: 40.4 % (ref 36.0–46.0)
Hemoglobin: 14.9 g/dL (ref 12.0–15.0)
MCH: 35.6 pg — ABNORMAL HIGH (ref 26.0–34.0)
MCHC: 36.9 g/dL — AB (ref 30.0–36.0)
MCV: 96.4 fL (ref 78.0–100.0)
Platelets: 223 10*3/uL (ref 150–400)
RBC: 4.19 MIL/uL (ref 3.87–5.11)
RDW: 12.2 % (ref 11.5–15.5)
WBC: 5 10*3/uL (ref 4.0–10.5)

## 2016-12-03 LAB — RAPID URINE DRUG SCREEN, HOSP PERFORMED
Amphetamines: POSITIVE — AB
BARBITURATES: NOT DETECTED
Benzodiazepines: NOT DETECTED
Cocaine: NOT DETECTED
Opiates: NOT DETECTED
Tetrahydrocannabinol: NOT DETECTED

## 2016-12-03 LAB — COMPREHENSIVE METABOLIC PANEL
ALBUMIN: 4.3 g/dL (ref 3.5–5.0)
ALK PHOS: 60 U/L (ref 38–126)
ALT: 31 U/L (ref 14–54)
ANION GAP: 12 (ref 5–15)
AST: 36 U/L (ref 15–41)
BILIRUBIN TOTAL: 0.9 mg/dL (ref 0.3–1.2)
BUN: 7 mg/dL (ref 6–20)
CALCIUM: 9.4 mg/dL (ref 8.9–10.3)
CO2: 21 mmol/L — ABNORMAL LOW (ref 22–32)
Chloride: 99 mmol/L — ABNORMAL LOW (ref 101–111)
Creatinine, Ser: 0.58 mg/dL (ref 0.44–1.00)
GFR calc Af Amer: >60 mL/min (ref >60)
GLUCOSE: 165 mg/dL — AB (ref 65–99)
Potassium: 3.6 mmol/L (ref 3.5–5.1)
Sodium: 132 mmol/L — ABNORMAL LOW (ref 135–145)
TOTAL PROTEIN: 7.5 g/dL (ref 6.5–8.1)

## 2016-12-03 LAB — SALICYLATE LEVEL: Salicylate Lvl: <7 mg/dL (ref 2.8–30.0)

## 2016-12-03 LAB — ACETAMINOPHEN LEVEL

## 2016-12-03 LAB — ETHANOL: Alcohol, Ethyl (B): 40 mg/dL — ABNORMAL HIGH (ref <5)

## 2016-12-03 MED ORDER — ONDANSETRON HCL 4 MG PO TABS: 4.0000 mg | ORAL_TABLET | Freq: Three times a day (TID) | ORAL | Status: DC | PRN

## 2016-12-03 MED ORDER — LORAZEPAM 1 MG PO TABS: 0.0000 mg | ORAL_TABLET | Freq: Two times a day (BID) | ORAL | Status: DC

## 2016-12-03 MED ORDER — THIAMINE HCL 100 MG/ML IJ SOLN: 100.0000 mg | Freq: Every day | INTRAMUSCULAR | Status: DC

## 2016-12-03 MED ORDER — LORAZEPAM 2 MG/ML IJ SOLN: 0.0000 mg | Freq: Four times a day (QID) | INTRAMUSCULAR | Status: DC

## 2016-12-03 MED ORDER — ACETAMINOPHEN 500 MG PO TABS
1000.0000 mg | ORAL_TABLET | Freq: Four times a day (QID) | ORAL | Status: DC | PRN
  Administered 2016-12-03: 1000 mg via ORAL
  Filled 2016-12-03: qty 2

## 2016-12-03 MED ORDER — LIDOCAINE 5 % EX PTCH
1.0000 | MEDICATED_PATCH | CUTANEOUS | Status: DC
  Administered 2016-12-03: 1 via TRANSDERMAL
  Filled 2016-12-03: qty 1

## 2016-12-03 MED ORDER — HALOPERIDOL 5 MG PO TABS
5.0000 mg | ORAL_TABLET | Freq: Once | ORAL | Status: AC
  Administered 2016-12-03: 5 mg via ORAL
  Filled 2016-12-03: qty 1

## 2016-12-03 MED ORDER — LORAZEPAM 1 MG PO TABS
0.0000 mg | ORAL_TABLET | Freq: Four times a day (QID) | ORAL | Status: DC
  Administered 2016-12-03: 1 mg via ORAL
  Administered 2016-12-04: 2 mg via ORAL
  Administered 2016-12-04: 1 mg via ORAL
  Filled 2016-12-03 (×2): qty 1
  Filled 2016-12-03: qty 2

## 2016-12-03 MED ORDER — LISINOPRIL 5 MG PO TABS
5.0000 mg | ORAL_TABLET | Freq: Every day | ORAL | Status: DC
  Administered 2016-12-03: 5 mg via ORAL
  Filled 2016-12-03 (×2): qty 1

## 2016-12-03 MED ORDER — NICOTINE 21 MG/24HR TD PT24
21.0000 mg | MEDICATED_PATCH | Freq: Once | TRANSDERMAL | Status: DC
  Administered 2016-12-03: 21 mg via TRANSDERMAL
  Filled 2016-12-03: qty 1

## 2016-12-03 MED ORDER — VITAMIN B-1 100 MG PO TABS
100.0000 mg | ORAL_TABLET | Freq: Every day | ORAL | Status: DC
  Administered 2016-12-03 – 2016-12-04 (×2): 100 mg via ORAL
  Filled 2016-12-03 (×2): qty 1

## 2016-12-03 MED ORDER — LORAZEPAM 2 MG/ML IJ SOLN: 0.0000 mg | Freq: Two times a day (BID) | INTRAMUSCULAR | Status: DC

## 2016-12-03 NOTE — ED Notes (Signed)
Report given to [facility] RN 

## 2016-12-03 NOTE — BH Assessment (Addendum)
Assessment Note  Olivia Harris is an 66 y.o. female with history of ADHD, depression, and anxiety. Patient's friend at bedside. He explains that patient has been expressing suicidal thoughts. States, "We have known each other for years.Marland KitchenMarland KitchenShe is always straight forward and very direct so when she told me she was suicidal I took it serious". The friend contacted his own personal PCP and asked for advice. He was given referrals to local outpatient therapist. Patient and her friend contacted the referrals provided. Unfortunately, she was not able to get an appointment with a psychiatrist anytime soon. Patient became irritated about this and further made suicidal ideations. The patient called his person PCP back to ask for further advice. The friend was instructed to bring patient to the Emergency Department for a mental health assessment.  Patient arrived to [facility] and verbalized suicidal thoughts. She later recanted her statements after realizing she would be made to stay for possible inpatient treatment.   Writer met with patient face to face. She was apparently very frustrated about being held in the Emergency Department. Patient tells me she was suicidal upon arrival but now changes her mind. She states, "I didn't know you could keep me here against my will because of suicidal comments". She denies stressors and states, "I am no longer sad .Marland KitchenMarland KitchenMarland KitchenI am happy now". The friend who patient allowed to stay during the TTS assessment attempted to provide information during the TTS assessment. The patient told him, "Don't you tell them anything and if you do.... I need to know about it first". Patient attempted to take her friend in the bathroom for a private discussion. I told them both that they could not be in the restroom alone together.  She appeared fearful of what her friend would tell me. Writer reminded patient that she did not have to have a guest present during the assessment as this was her choice.  She  denies history of suicide attempts or gestures. No self mutilating behaviors. Denies a family history of mental health illness. No history of INPT mental health hospitalizations.  Denis HI and AVH's. No legal issues. She drinks alcohol 3x's a day (sparking liqour). Last drink was this afternoon. She denies illicit drug use. States that she is prescribed Adder al and takes it regularly. Patient appears very angry and irritable during the assessment. 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   Diagnosis: Major Depressive Disorder, Recurrent, Severe without psychotic features; ADHD; Anxiety; Alcohol Use Disorder, Severe  Past Medical History:  Past Medical History:  Diagnosis Date  . ADHD (attention deficit hyperactivity disorder) Dr.Steiner  . Anxiety Dr.Steiner  . Arthritis   . Decreased libido   . Depression Dr.Steiner  . Essential hypertension, benign  07/29/2012  . Onychomycosis   . Postmenopausal   . Scoliosis   . Scoliosis    degenerative   . Seborrheic dermatitis     Past Surgical History:  Procedure Laterality Date  . arthroscopic surgery shoulder  right, 1994(Dr.Rowan) bone spur removed    Family History:  Family History  Problem Relation Age of Onset  . Thyroid disease Mother   . Arthritis Mother   . Heart disease Mother        CHF  . Hypertension Brother   . Arthritis Sister   . Arthritis  Unknown        grandmother  . Hyperlipidemia Sister   . Heart disease Unknown        grandmother  . Stroke Unknown        grandfather  . Hypertension Brother     Social History:  reports that she has quit smoking. She smoked 0.20 packs per day. She has never used smokeless tobacco. She reports that she drinks alcohol. She reports that she does not use drugs.  Additional Social History:   Alcohol / Drug Use Pain Medications: SEE MAR Prescriptions: SEE MAR Over the Counter: SEE MAR History of alcohol / drug use?: Yes Substance #1 Name of Substance 1: Alcohol  1 - Age of First Use: "I can't remember" 1 - Amount (size/oz): "I drink sparking alcohol 3'xs per day...a glass each time" 1 - Frequency: 3x's per day 1 - Duration: on-going  1 - Last Use / Amount: 12/03/2016; "I had a drink mid morning"  CIWA:   COWS:    Allergies:  Allergies  Allergen Reactions  . Antihistamines, Chlorpheniramine-Type Other (See Comments)    hyperactivity    Home Medications:  (Not in a hospital admission)  OB/GYN Status:  No LMP recorded. Patient is postmenopausal.  General Assessment Data TTS Assessment: In system Is this a Tele or Face-to-Face Assessment?: Face-to-Face Is this an Initial Assessment or a Re-assessment for this encounter?: Initial Assessment Marital status: Single Maiden name:  (n/a) Is patient pregnant?: No Pregnancy Status: No Living Arrangements: Other (Comment), Alone Can pt return to current living arrangement?: Yes Admission Status: Voluntary Is patient capable of signing voluntary admission?: Yes Referral Source: Self/Family/Friend Insurance type:  Secretary/administrator )     [place] Living Arrangements: Other (Comment), Alone Legal Guardian: Other: (no legal guardian/ POA Shayne Alken) Name of Psychiatrist:  (no psychiatrist ) Name of Therapist:  (no therapist )  Education Status Is patient currently in school?: No Current Grade:  (n/a) Highest grade of school patient has completed:  (Masters level Education officer, museum) Name of school:  (n/a) Contact person:  (n/a)  Risk to self with the past 6 months Suicidal Ideation: Yes-Currently Present Has patient been a risk to self within the past 6 months prior to admission? : Yes Suicidal Intent: No Has patient had any suicidal intent within the past 6 months prior to admission? : No Is patient  at risk for suicide?: No Suicidal Plan?: No Has patient had any suicidal plan within the past 6 months prior to admission? : No Access to Means: No What has been your use of drugs/alcohol within the last 12 months?:  (daily alcohol use ) Previous Attempts/Gestures: No How many times?:  (0) Other Self Harm Risks:  (denies self harm ) Triggers for Past Attempts: Other (Comment) (no prior suicide attempts or gestures ) Intentional Self Injurious Behavior: None Family Suicide History: Unknown Recent stressful life event(s): Other (Comment) ("None") Persecutory voices/beliefs?: No Depression: Yes Depression Symptoms: Loss of interest in usual pleasures Substance abuse history and/or treatment for substance abuse?: No Suicide prevention information given to non-admitted patients: Not applicable  Risk to Others within the past 6 months Homicidal Ideation: No Does patient have any lifetime risk of violence toward others beyond the six months prior to admission? : No Thoughts of Harm to Others: No Current Homicidal Intent: No Current Homicidal Plan: No Access to Homicidal Means: No Identified Victim:  (n/a) History of harm to others?: No Assessment of Violence: None Noted Violent Behavior Description:  (  n/a) Does patient have access to weapons?: No Criminal Charges Pending?: No Does patient have a court date: No Is patient on probation?: No  Psychosis Hallucinations: None noted Delusions: None noted  Mental Status Report Appearance/Hygiene: In scrubs Eye Contact: Good Motor Activity: Restlessness Speech: Logical/coherent Level of Consciousness: Alert Mood: Anxious, Depressed, Irritable Affect: Apprehensive Anxiety Level: Minimal Thought Processes: Relevant, Coherent Judgement: Impaired Orientation: Person, Situation, Time, Place Obsessive Compulsive Thoughts/Behaviors: None  Cognitive Functioning Concentration: Decreased Memory: Recent Intact, Remote Intact IQ:  Average Insight: Poor Impulse Control: Poor Appetite: Fair Weight Loss:  (none reported) Weight Gain:  (none reported) Sleep: Decreased Total Hours of Sleep:  (varies ) Vegetative Symptoms: None  ADLScreening [facility] Assessment Services) Patient's cognitive ability adequate to safely complete daily activities?: Yes Patient able to express need for assistance with ADLs?: Yes Independently performs ADLs?: Yes (appropriate for developmental age)  Prior Inpatient Therapy Prior Inpatient Therapy: No Prior Therapy Dates:  (n/a) Prior Therapy Facilty/Provider(s):  (n/a) Reason for Treatment:  (n/a)  Prior Outpatient Therapy Prior Outpatient Therapy: No Prior Therapy Dates:  (n/a) Prior Therapy Facilty/Provider(s):  (n/a) Reason for Treatment:  (n/a) Does patient have an ACCT team?: No Does patient have Intensive In-House Services?  : No Does patient have [facility] services? : No Does patient have P4CC services?: No  ADL Screening (condition at time of admission) Patient's cognitive ability adequate to safely complete daily activities?: Yes Is the patient deaf or have difficulty hearing?: No Does the patient have difficulty seeing, even when wearing glasses/contacts?: No Does the patient have difficulty concentrating, remembering, or making decisions?: No Patient able to express need for assistance with ADLs?: Yes Does the patient have difficulty dressing or bathing?: No Independently performs ADLs?: Yes (appropriate for developmental age) Does the patient have difficulty walking or climbing stairs?: No Weakness of Legs: None Weakness of Arms/Hands: None  Home Assistive Devices/Equipment Home Assistive Devices/Equipment: None    Abuse/Neglect Assessment (Assessment to be complete while patient is alone) Physical Abuse: Denies Verbal Abuse: Denies Sexual Abuse: Denies Exploitation of patient/patient's resources: Denies Self-Neglect: Denies Values / Beliefs Cultural Requests  During Hospitalization: None Spiritual Requests During Hospitalization: None   Advance Directives (For Healthcare) Does Patient Have a Medical Advance Directive?: No Nutrition Screen- [facility] Adult/[facility]/AP Patient's home diet: Regular  Additional Information 1:1 In Past 12 Months?: No CIRT Risk: No Elopement Risk: No Does patient have medical clearance?: Yes     Disposition:  Disposition Initial Assessment Completed for this Encounter: Yes Disposition of Patient: Other dispositions (Per Waylan Boga, DNP, overnight observation) Other disposition(s): Other (Comment) (Pending am psych evaluation )  On Site Evaluation by:   Reviewed with Physician:    Waldon Merl 12/03/2016 5:25 PM

## 2016-12-03 NOTE — ED Notes (Signed)
Patient currently denies SI,HI and AVH at this time. Patient is irritable at this time. Patient does [facility]. Patient complains of headache and left knee pain. Dr. Rolland PorterMark James contacted and informed of patient pain. Dr. Fayrene FearingJames reported he would place medication orders. [facility]. Plan of care discussed. Encouragement and support provided and safety maintain. Q 15 min safety checks in place and video monitoring.

## 2016-12-03 NOTE — ED Notes (Signed)
Dr. Fayrene FearingJames contacted and informed that patient smokes e-cigarettes and would like a nicotine patch.

## 2016-12-03 NOTE — ED Provider Notes (Signed)
[facility]-EMERGENCY DEPT Provider Note   CSN: [id number] Arrival date & time: 12/03/16  1401     History   Chief Complaint No chief complaint on file.   HPI Olivia Harris is a 66 y.o. female.  HPI   66 year old female with a history of ADHD, depression, hypertension presents with concern for suicidal ideation.   When I asked the patient why she is here she says "so he (her friend Ree KidaJack) won't feel guilty because I'm going to kill myself." When asked if she has a plan she says yes. When I ask her what the plan is, she reports "Why does it matter?"  She reports she has not had thoughts like this before.  Asks "when can I go?" When told that I did not feel safe discharging her, she became agitated at Ree KidaJack "I thought you said if I came here they wouldn't keep me! I thought you said they couldn't commit me."   She then immediately stated "now I thought about it and I changed my mind. I don't want to kill myself." She is agitated and reluctant to provide more history.  Uses etoh. Denies medical concerns.    Past Medical History:  Diagnosis Date  . ADHD (attention deficit hyperactivity disorder) Dr.Steiner  . Anxiety Dr.Steiner  . Arthritis   . Decreased libido   . Depression Dr.Steiner  . Essential hypertension, benign  07/29/2012  . Onychomycosis   . Postmenopausal   . Scoliosis   . Scoliosis    degenerative   . Seborrheic dermatitis     Patient Active Problem List   Diagnosis Date Noted  . Osteopenia, last dexa 07/2012, Ca Vit D Exercise, repeat 2 years 07/29/2012  . Essential hypertension, benign  07/29/2012  . Vaginal bleeding - followed by Dr. Nikki DomMody, [facility] 07/29/2012    Past Surgical History:  Procedure Laterality Date  . arthroscopic surgery shoulder  right, 1994(Dr.Rowan) bone spur removed    OB History    Gravida Para Term Preterm AB Living   2 1           SAB TAB Ectopic Multiple Live Births                   Home Medications    Prior to Admission  medications   Medication Sig Start Date End Date Taking? Authorizing Provider  amphetamine-dextroamphetamine (ADDERALL) 30 MG tablet Take 15 mg by mouth 2 (two) times daily.    Yes [provider]  lisinopril (PRINIVIL,ZESTRIL) 5 MG tablet take 1 tablet by mouth once daily 08/08/15  Yes Terressa KoyanagiKim, Hannah R, DO    Family History Family History  Problem Relation Age of Onset  . Thyroid disease Mother   . Arthritis Mother   . Heart disease Mother        CHF  . Hypertension Brother   . Arthritis Sister   . Arthritis Unknown        grandmother  . Hyperlipidemia Sister   . Heart disease Unknown        grandmother  . Stroke Unknown        grandfather  . Hypertension Brother     Social History Social History  Substance Use Topics  . Smoking status: Former Smoker    Packs/day: 0.20  . Smokeless tobacco: Never Used  . Alcohol use Yes     Comment: 1-2 drinks per day     Allergies   Antihistamines, chlorpheniramine-type   Review of Systems Review of Systems  Constitutional: Negative for fever.  Respiratory: Negative for cough.   Gastrointestinal: Negative for vomiting.  Psychiatric/Behavioral: Positive for suicidal ideas.     Physical Exam Updated Vital Signs BP (!) 138/99 (BP Location: Right Arm)   Pulse (!) 116   Temp 99 F (37.2 C) (Oral)   Resp 18   SpO2 96%   Physical Exam  Constitutional: She is oriented to person, place, and time. She appears well-developed and well-nourished. No distress.  HENT:  Head: Normocephalic and atraumatic.  Eyes: Conjunctivae and EOM are normal.  Neck: Normal range of motion.  Cardiovascular: Regular rhythm and intact distal pulses.  Tachycardia present.   Pulmonary/Chest: Effort normal. No respiratory distress.  Musculoskeletal: She exhibits no edema or tenderness.  Neurological: She is alert and oriented to person, place, and time.  Skin: Skin is warm and dry. No rash noted. She is not diaphoretic. No erythema.    Psychiatric: She expresses suicidal ideation. She expresses suicidal plans.  Nursing note and vitals reviewed.    ED Treatments / Results  Labs (all labs ordered are listed, but only abnormal results are displayed) Labs Reviewed  COMPREHENSIVE METABOLIC PANEL - Abnormal; Notable for the following:       Result Value   Sodium 132 (*)    Chloride 99 (*)    CO2 21 (*)    Glucose, Bld 165 (*)    All other components within normal limits  ETHANOL - Abnormal; Notable for the following:    Alcohol, Ethyl (B) 40 (*)    All other components within normal limits  ACETAMINOPHEN LEVEL - Abnormal; Notable for the following:    Acetaminophen (Tylenol), Serum <10 (*)    All other components within normal limits  CBC - Abnormal; Notable for the following:    MCH 35.6 (*)    MCHC 36.9 (*)    All other components within normal limits  RAPID URINE DRUG SCREEN, HOSP PERFORMED - Abnormal; Notable for the following:    Amphetamines POSITIVE (*)    All other components within normal limits  SALICYLATE LEVEL    EKG  EKG Interpretation  Date/Time:  Thursday December 03 2016 14:54:53 EDT Ventricular Rate:  134 PR Interval:  138 QRS Duration: 70 QT Interval:  284 QTC Calculation: 424 R Axis:   -33 Text Interpretation:  Sinus tachycardia with occasional Premature ventricular complexes Left axis deviation Low voltage QRS Cannot rule out Anterior infarct , age undetermined Abnormal ECG No previous ECGs available Confirmed by Alvira Monday ([id number]) on 12/03/2016 6:00:55 PM       Radiology No results found.  Procedures Procedures (including critical care time)  Medications Ordered in ED Medications  LORazepam (ATIVAN) injection 0-4 mg ( Intravenous See Alternative 12/03/16 1835)    Or  LORazepam (ATIVAN) tablet 0-4 mg (1 mg Oral Given 12/03/16 1835)  LORazepam (ATIVAN) injection 0-4 mg (not administered)    Or  LORazepam (ATIVAN) tablet 0-4 mg (not administered)  thiamine (VITAMIN B-1)  tablet 100 mg (100 mg Oral Given 12/03/16 1834)    Or  thiamine (B-1) injection 100 mg ( Intravenous See Alternative 12/03/16 1834)  ondansetron (ZOFRAN) tablet 4 mg (not administered)  lisinopril (PRINIVIL,ZESTRIL) tablet 5 mg (not administered)  haloperidol (HALDOL) tablet 5 mg (5 mg Oral Given 12/03/16 1616)     Initial Impression / Assessment and Plan / ED Course  I have reviewed the triage vital signs and the nursing notes.  Pertinent labs & imaging results that were available during my care  of the patient were reviewed by me and considered in my medical decision making (see chart for details).     67 year old female with a history of ADHD, depression, hypertension presents with concern for suicidal ideation with a plan.  Patient flatly says on arrival that she is going to kill herself and she is here because she doesn't want her friend [name] to feel guilty and he convinced her to come.  She reports she wants go to home and I stated I did not feel safe given her suicidal ideation, concern for her safety.  Suspect tachycardia is component of withdrawal and agitation.  She denies medical concerns. Labs without acute abnormalities.   TTS consulted.  Patient is currently voluntary, however I would IVC her if she tries to leave and I told her this given her reporting certainty on arrival "I'm going to kill myself" with a plan and her immediately recanting the statement when told inpatient treatment would be recommended.  CIWA and home medication placed.     Final Clinical Impressions(s) / ED Diagnoses   Final diagnoses:  Planning to commit suicide    New Prescriptions New Prescriptions   No medications on file     Alvira Monday, MD 12/03/16 [phone]

## 2016-12-04 DIAGNOSIS — F322 Major depressive disorder, single episode, severe without psychotic features: Secondary | ICD-10-CM | POA: Diagnosis present

## 2016-12-04 DIAGNOSIS — R45851 Suicidal ideations: Secondary | ICD-10-CM | POA: Diagnosis not present

## 2016-12-04 DIAGNOSIS — F321 Major depressive disorder, single episode, moderate: Secondary | ICD-10-CM | POA: Diagnosis not present

## 2016-12-04 NOTE — ED Notes (Signed)
Pt discharged home. Discharged instructions read to pt who verbalized understanding. All belongings returned to pt who signed for same. Denies SI/HI, is not delusional and not responding to internal stimuli. Escorted pt to the ED exit.    

## 2016-12-04 NOTE — [facility] Suicide Risk Assessment (Signed)
Suicide Risk Assessment  Discharge Assessment   [facility]   Principal Problem: Major depressive disorder, single episode, moderate degree (HCC) Discharge Diagnoses:  Patient Active Problem List   Diagnosis Date Noted  . Major depressive disorder, single episode, moderate degree (HCC) [F32.1] 12/04/2016    Priority: High  . Osteopenia, last dexa 07/2012, Ca Vit D Exercise, repeat 2 years [M85.80] 07/29/2012  . Essential hypertension, benign  [I10] 07/29/2012  . Vaginal bleeding - followed by Dr. Nikki DomMody, [facility] [N93.9] 07/29/2012    Total Time spent with patient: 45 minutes   Musculoskeletal: Strength & Muscle Tone: within normal limits Gait & Station: normal Patient leans: N/A  Psychiatric Specialty Exam: Physical Exam  Constitutional: She is oriented to person, place, and time. She appears well-developed and well-nourished.  HENT:  Head: Normocephalic.  Neck: Normal range of motion.  Respiratory: Effort normal.  Musculoskeletal: Normal range of motion.  Neurological: She is alert and oriented to person, place, and time.  Psychiatric: Her speech is normal and behavior is normal. Judgment and thought content normal. Cognition and memory are normal. She exhibits a depressed mood.    Review of Systems  Psychiatric/Behavioral: Positive for depression and substance abuse.  All other systems reviewed and are negative.   Blood pressure 129/82, pulse (!) 108, temperature 98 F (36.7 C), temperature source Oral, resp. rate 18, SpO2 100 %.There is no height or weight on file to calculate BMI.  General Appearance: Casual  Eye Contact:  Good  Speech:  Normal Rate  Volume:  Normal  Mood:  Depressed  Affect:  Congruent  Thought Process:  Coherent and Descriptions of Associations: Intact  Orientation:  Full (Time, Place, and Person)  Thought Content:  WDL and Logical  Suicidal Thoughts:  No  Homicidal Thoughts:  No  Memory:  Immediate;    Good Recent;   Good Remote;   Good  Judgement:  Fair  Insight:  Good  Psychomotor Activity:  Normal  Concentration:  Concentration: Good and Attention Span: Good  Recall:  Good  Fund of Knowledge:  Good  Language:  Good  Akathisia:  No  Handed:  Right  AIMS (if indicated):     Assets:  Leisure Time Physical Health Resilience Social Support  ADL's:  Intact  Cognition:  WNL  Sleep:      Mental Status Per Nursing Assessment::   On Admission:   depression with suicidal ideations  Demographic Factors:  Age 65 or older and Caucasian  Loss Factors: NA  Historical Factors: NA  Risk Reduction Factors:   Sense of responsibility to family, Employed and Positive social support  Continued Clinical Symptoms:  Depression, moderate  Cognitive Features That Contribute To Risk:  None    Suicide Risk:  Minimal: No identifiable suicidal ideation.  Patients presenting with no risk factors but with morbid ruminations; may be classified as minimal risk based on the severity of the depressive symptoms    Plan Of Care/Follow-up recommendations:  Activity:  as tolerated Diet:  heart healthy diet  Henya Aguallo, NP 12/04/2016, 1:41 PM

## 2016-12-04 NOTE — Progress Notes (Signed)
Pharmacy IV to PO conversion  The patient is ordered Thiamine by the intravenous route with a linked PO option available.  Based on criteria approved by the Pharmacy and Therapeutics Committee and the Medical Executive Committee, the IV option is being discontinued.   No active GI bleeding or impaired absorption  Not s/p esophagectomy  Documented ability to take oral medications for > 24 hr  Plan to continue treatment for at least 1 day  If you have any questions about this conversion, please contact the Pharmacy Department (ext [phone]).  Thank you.  Bernadene Personrew Trinitey Roache, PharmD Pager: [phone] 12/04/2016, 12:44 PM

## 2016-12-04 NOTE — BH Assessment (Signed)
[facility] Assessment Progress Note  Per Thedore MinsMojeed Akintayo, MD, this pt does not require psychiatric hospitalization at this time.  [facility].  At 12:00 this Clinical research associatewriter called the [facility] and spoke to HookerLatoya.  Pt is scheduled for an intake appointment on Friday, 12/11/2016 at 13:45.  Pt is to arrive 10 minutes early for the first visit to fill out intake paperwork.  This has been included in pt's discharge instructions.  Pt's nurse, Diane, has been notified.  Doylene Canninghomas Janaisha Tolsma, MA Triage Specialist [phone]

## 2016-12-04 NOTE — Discharge Instructions (Signed)
For your behavioral health needs, you are advised to follow up with the [facility].  You are scheduled for an intake appointment with Thedore MinsMojeed Akintayo, MD on Friday, December 11, 2016 at 1:45 pm.  For your first visit, please plan to be there 10 minutes early to fill out intake paperwork:       [facility]      [address]      [place], [place] [address]      [phone]

## 2016-12-04 NOTE — Consult Note (Signed)
[facility] Consult   Reason for Consult:  Depression with suicidal ideations Referring Physician:  EDP Patient Identification: Olivia Harris MRN:  [id number] Principal Diagnosis: Major depressive disorder, single episode, moderate degree ([place]) Diagnosis:   Patient Active Problem List   Diagnosis Date Noted  . Major depressive disorder, single episode, moderate degree ([place]) [F32.1] 12/04/2016    Priority: High  . Osteopenia, last dexa 07/2012, Ca Vit D Exercise, repeat 2 years [M85.80] 07/29/2012  . Essential hypertension, benign  [I10] 07/29/2012  . Vaginal bleeding - followed by Dr. Briant Sites ob/gyn [N93.9] 07/29/2012    Total Time spent with patient: 45 minutes  Subjective:   Olivia Harris is a 66 y.o. female patient does not warrant admission.  HPI:  66 yo female who presented to the ED with suicidal ideations with her friend.  She then denied this.  Olivia Harris reports her depression has increased and was frustrated with the wait to see a psychiatrist but does not feel suicidal, would want to do that to her friend/Olivia Harris.  Minimizes her alcohol use and does not feel it is an issue.  No homicidal ideations, hallucinations, or withdrawal symptoms.  She is busy with her work and needs to [employer].  Excited that her psychiatric appointment was able to be moved to next week by one of our counselor.    Past Psychiatric History: depression  Risk to Self: None Risk to Others: Homicidal Ideation: No Thoughts of Harm to Others: No Current Homicidal Intent: No Current Homicidal Plan: No Access to Homicidal Means: No Identified Victim:  (n/a) History of harm to others?: No Assessment of Violence: None Noted Violent Behavior Description:  (n/a) Does patient have access to weapons?: No Criminal Charges Pending?: No Does patient have a court date: No Prior Inpatient Therapy: Prior Inpatient Therapy: No Prior Therapy Dates:  (n/a) Prior Therapy Facilty/Provider(s):   (n/a) Reason for Treatment:  (n/a) Prior Outpatient Therapy: Prior Outpatient Therapy: No Prior Therapy Dates:  (n/a) Prior Therapy Facilty/Provider(s):  (n/a) Reason for Treatment:  (n/a) Does patient have an ACCT team?: No Does patient have Intensive In-House Services?  : No Does patient have [facility] services? : No Does patient have [facility] services?: No  Past Medical History:  Past Medical History:  Diagnosis Date  . ADHD (attention deficit hyperactivity disorder) Dr.Steiner  . Anxiety Dr.Steiner  . Arthritis   . Decreased libido   . Depression Dr.Steiner  . Essential hypertension, benign  07/29/2012  . Onychomycosis   . Postmenopausal   . Scoliosis   . Scoliosis    degenerative   . Seborrheic dermatitis     Past Surgical History:  Procedure Laterality Date  . arthroscopic surgery shoulder  right, 1994(Dr.Rowan) bone spur removed   Family History:  Family History  Problem Relation Age of Onset  . Thyroid disease Mother   . Arthritis Mother   . Heart disease Mother        CHF  . Hypertension Brother   . Arthritis Sister   . Arthritis Unknown        grandmother  . Hyperlipidemia Sister   . Heart disease Unknown        grandmother  . Stroke Unknown        grandfather  . Hypertension Brother    Family Psychiatric  History: unknown Social History:  History  Alcohol Use  . Yes    Comment: 1-2 drinks per day     History  Drug Use No  Social History   Social History  . Marital status: Single    Spouse name: N/A  . Number of children: N/A  . Years of education: N/A   Social History Main Topics  . Smoking status: Former Smoker    Packs/day: 0.20  . Smokeless tobacco: Never Used  . Alcohol use Yes     Comment: 1-2 drinks per day  . Drug use: No  . Sexual activity: Not Asked   Other Topics Concern  . None   Social History Narrative  . None   Additional Social History:    Allergies:   Allergies  Allergen Reactions  . Antihistamines,  Chlorpheniramine-Type Other (See Comments)    hyperactivity    Labs:  Results for orders placed or performed during the hospital encounter of 12/03/16 (from the past 48 hour(s))  Comprehensive metabolic panel     Status: Abnormal   Collection Time: 12/03/16  2:31 PM  Result Value Ref Range   Sodium 132 (L) 135 - 145 mmol/L   Potassium 3.6 3.5 - 5.1 mmol/L   Chloride 99 (L) 101 - 111 mmol/L   CO2 21 (L) 22 - 32 mmol/L   Glucose, Bld 165 (H) 65 - 99 mg/dL   BUN 7 6 - 20 mg/dL   Creatinine, Ser 0.58 0.44 - 1.00 mg/dL   Calcium 9.4 8.9 - 10.3 mg/dL   Total Protein 7.5 6.5 - 8.1 g/dL   Albumin 4.3 3.5 - 5.0 g/dL   AST 36 15 - 41 U/L   ALT 31 14 - 54 U/L   Alkaline Phosphatase 60 38 - 126 U/L   Total Bilirubin 0.9 0.3 - 1.2 mg/dL   GFR calc non Af Amer >60 >60 mL/min   GFR calc Af Amer >60 >60 mL/min    Comment: (NOTE) The eGFR has been calculated using the CKD EPI equation. This calculation has not been validated in all clinical situations. eGFR's persistently <60 mL/min signify possible Chronic Kidney Disease.    Anion gap 12 5 - 15  Ethanol     Status: Abnormal   Collection Time: 12/03/16  2:31 PM  Result Value Ref Range   Alcohol, Ethyl (B) 40 (H) <5 mg/dL    Comment:        LOWEST DETECTABLE LIMIT FOR SERUM ALCOHOL IS 5 mg/dL FOR MEDICAL PURPOSES ONLY   Salicylate level     Status: None   Collection Time: 12/03/16  2:31 PM  Result Value Ref Range   Salicylate Lvl <0.2 2.8 - 30.0 mg/dL  Acetaminophen level     Status: Abnormal   Collection Time: 12/03/16  2:31 PM  Result Value Ref Range   Acetaminophen (Tylenol), Serum <10 (L) 10 - 30 ug/mL    Comment:        THERAPEUTIC CONCENTRATIONS VARY SIGNIFICANTLY. A RANGE OF 10-30 ug/mL MAY BE AN EFFECTIVE CONCENTRATION FOR MANY PATIENTS. HOWEVER, SOME ARE BEST TREATED AT CONCENTRATIONS OUTSIDE THIS RANGE. ACETAMINOPHEN CONCENTRATIONS >150 ug/mL AT 4 HOURS AFTER INGESTION AND >50 ug/mL AT 12 HOURS AFTER INGESTION  ARE OFTEN ASSOCIATED WITH TOXIC REACTIONS.   cbc     Status: Abnormal   Collection Time: 12/03/16  2:31 PM  Result Value Ref Range   WBC 5.0 4.0 - 10.5 K/uL   RBC 4.19 3.87 - 5.11 MIL/uL   Hemoglobin 14.9 12.0 - 15.0 g/dL   HCT 40.4 36.0 - 46.0 %   MCV 96.4 78.0 - 100.0 fL   MCH 35.6 (H) 26.0 - 34.0 pg  MCHC 36.9 (H) 30.0 - 36.0 g/dL   RDW 12.2 11.5 - 15.5 %   Platelets 223 150 - 400 K/uL  Rapid urine drug screen (hospital performed)     Status: Abnormal   Collection Time: 12/03/16  2:49 PM  Result Value Ref Range   Opiates NONE DETECTED NONE DETECTED   Cocaine NONE DETECTED NONE DETECTED   Benzodiazepines NONE DETECTED NONE DETECTED   Amphetamines POSITIVE (A) NONE DETECTED   Tetrahydrocannabinol NONE DETECTED NONE DETECTED   Barbiturates NONE DETECTED NONE DETECTED    Comment:        DRUG SCREEN FOR MEDICAL PURPOSES ONLY.  IF CONFIRMATION IS NEEDED FOR ANY PURPOSE, NOTIFY LAB WITHIN 5 DAYS.        LOWEST DETECTABLE LIMITS FOR URINE DRUG SCREEN Drug Class       Cutoff (ng/mL) Amphetamine      1000 Barbiturate      200 Benzodiazepine   709 Tricyclics       628 Opiates          300 Cocaine          300 THC              50     Current Facility-Administered Medications  Medication Dose Route Frequency Provider Last Rate Last Dose  . acetaminophen (TYLENOL) tablet 1,000 mg  1,000 mg Oral Q6H PRN Tanna Furry, MD   1,000 mg at 12/03/16 1922  . lidocaine (LIDODERM) 5 % 1 patch  1 patch Transdermal Q24H Tanna Furry, MD   1 patch at 12/03/16 1925  . lisinopril (PRINIVIL,ZESTRIL) tablet 5 mg  5 mg Oral Daily Gareth Morgan, MD   5 mg at 12/03/16 2127  . LORazepam (ATIVAN) injection 0-4 mg  0-4 mg Intravenous Q6H Gareth Morgan, MD       Or  . LORazepam (ATIVAN) tablet 0-4 mg  0-4 mg Oral Q6H Gareth Morgan, MD   1 mg at 12/04/16 [phone]  . [START ON 12/06/2016] LORazepam (ATIVAN) injection 0-4 mg  0-4 mg Intravenous Q12H Gareth Morgan, MD       Or  . Derrill Memo ON  12/06/2016] LORazepam (ATIVAN) tablet 0-4 mg  0-4 mg Oral Q12H Schlossman, Erin, MD      . nicotine (NICODERM CQ - dosed in mg/24 hours) patch 21 mg  21 mg Transdermal Once Tanna Furry, MD   21 mg at 12/03/16 2127  . ondansetron (ZOFRAN) tablet 4 mg  4 mg Oral Q8H PRN Gareth Morgan, MD      . thiamine (VITAMIN B-1) tablet 100 mg  100 mg Oral Daily Gareth Morgan, MD   100 mg at 12/04/16 1013   Or  . thiamine (B-1) injection 100 mg  100 mg Intravenous Daily Gareth Morgan, MD       Current Outpatient Prescriptions  Medication Sig Dispense Refill  . amphetamine-dextroamphetamine (ADDERALL) 30 MG tablet Take 15 mg by mouth 2 (two) times daily.     [name] lisinopril (PRINIVIL,ZESTRIL) 5 MG tablet take 1 tablet by mouth once daily 30 tablet 0    Musculoskeletal: Strength & Muscle Tone: within normal limits Gait & Station: normal Patient leans: N/A  Psychiatric Specialty Exam: Physical Exam  Constitutional: She is oriented to person, place, and time. She appears well-developed and well-nourished.  HENT:  Head: Normocephalic.  Neck: Normal range of motion.  Respiratory: Effort normal.  Musculoskeletal: Normal range of motion.  Neurological: She is alert and oriented to person, place, and time.  Psychiatric: Her speech  is normal and behavior is normal. Judgment and thought content normal. Cognition and memory are normal. She exhibits a depressed mood.    Review of Systems  Psychiatric/Behavioral: Positive for depression and substance abuse.  All other systems reviewed and are negative.   Blood pressure 129/82, pulse (!) 108, temperature 98 F (36.7 C), temperature source Oral, resp. rate 18, SpO2 100 %.There is no height or weight on file to calculate BMI.  General Appearance: Casual  Eye Contact:  Good  Speech:  Normal Rate  Volume:  Normal  Mood:  Depressed  Affect:  Congruent  Thought Process:  Coherent and Descriptions of Associations: Intact  Orientation:  Full (Time, Place,  and Person)  Thought Content:  WDL and Logical  Suicidal Thoughts:  No  Homicidal Thoughts:  No  Memory:  Immediate;   Good Recent;   Good Remote;   Good  Judgement:  Fair  Insight:  Good  Psychomotor Activity:  Normal  Concentration:  Concentration: Good and Attention Span: Good  Recall:  Good  Fund of Knowledge:  Good  Language:  Good  Akathisia:  No  Handed:  Right  AIMS (if indicated):     Assets:  Leisure Time Physical Health Resilience Social Support  ADL's:  Intact  Cognition:  WNL  Sleep:        Treatment Plan Summary: Daily contact with patient to assess and evaluate symptoms and progress in treatment, Medication management and Plan major depressive disorder, recurrent, moderate:  -Crisis stabilization -Medication management:  Ativan alcohol detox protocol started while inpatient  -Psychiatric appointment made for next week -Individual and substance abuse counseling  Disposition: No evidence of imminent risk to self or others at present.    Waylan Boga, NP 12/04/2016 11:29 AM  Patient seen face-to-face for psychiatric evaluation, chart reviewed and case discussed with the physician extender and developed treatment plan. Reviewed the information documented and agree with the treatment plan. Corena Pilgrim, MD

## 2016-12-24 ENCOUNTER — Encounter: Payer: Self-pay | Admitting: Family Medicine

## 2017-04-15 ENCOUNTER — Encounter: Payer: Self-pay | Admitting: Family Medicine

## 2019-07-19 ENCOUNTER — Other Ambulatory Visit: Payer: Self-pay

## 2019-07-19 ENCOUNTER — Emergency Department (HOSPITAL_COMMUNITY)
Admission: EM | Admit: 2019-07-19 | Discharge: 2019-07-19 | Disposition: A | Discharge status: Home / Self Care | Payer: Medicare Other | Attending: Emergency Medicine | Admitting: Emergency Medicine

## 2019-07-19 ENCOUNTER — Encounter (HOSPITAL_COMMUNITY): Payer: Self-pay

## 2019-07-19 DIAGNOSIS — Z79899 Other long term (current) drug therapy: Secondary | ICD-10-CM | POA: Insufficient documentation

## 2019-07-19 DIAGNOSIS — Y929 Unspecified place or not applicable: Secondary | ICD-10-CM | POA: Insufficient documentation

## 2019-07-19 DIAGNOSIS — X58XXXA Exposure to other specified factors, initial encounter: Secondary | ICD-10-CM | POA: Insufficient documentation

## 2019-07-19 DIAGNOSIS — I10 Essential (primary) hypertension: Secondary | ICD-10-CM | POA: Insufficient documentation

## 2019-07-19 DIAGNOSIS — T18128A Food in esophagus causing other injury, initial encounter: Secondary | ICD-10-CM

## 2019-07-19 DIAGNOSIS — Y999 Unspecified external cause status: Secondary | ICD-10-CM | POA: Insufficient documentation

## 2019-07-19 DIAGNOSIS — Y9389 Activity, other specified: Secondary | ICD-10-CM | POA: Insufficient documentation

## 2019-07-19 DIAGNOSIS — R0789 Other chest pain: Secondary | ICD-10-CM | POA: Insufficient documentation

## 2019-07-19 DIAGNOSIS — K222 Esophageal obstruction: Secondary | ICD-10-CM

## 2019-07-19 NOTE — ED Triage Notes (Addendum)
Patient states she was eating chicken pie and feels like she has a piece of chicken in her throat. Patient is talking in complete sentences. Patient states she is able to swallow her own saliva now, but earlier she was having difficulty swallowing coffee and her own saliva.

## 2019-07-19 NOTE — Discharge Instructions (Signed)
I suspect you had a blockage of food in your throat that has gotten better on its own. Please see the gastroenterologist referral for further evaluation of your throat.

## 2019-07-19 NOTE — Progress Notes (Signed)
Patient given water for fluid challenge.  

## 2019-07-19 NOTE — ED Provider Notes (Signed)
Olivia Harris   Olivia Harris: Olivia Harris Arrival date & time: 07/19/19  0725     History Chief Complaint  Patient presents with  . Dysphagia    Olivia Harris is a 69 y.o. female.  Patient with no previous history of endoscopy presents the emergency department with concern for esophageal obstruction.  Patient states that she ate a large piece of chicken last night approximately 6 or 7 PM.  She felt like it got stuck in her throat and she was unable to handle her secretions.  She was unable to swallow liquids because she regurgitated.  She spent the night barely sleeping, sitting up, spitting into a towel or to a cup.  Patient expelled a larger amount of liquid at about 5 AM.  She has tolerated small amounts of coffee but with some discomfort since that time.  This prompted emergency department visit.  Patient does not have any history of esophageal strictures.  She denies any recent unexplained weight loss.  She states that infrequently over the past year she feels food gets stuck temporarily on the right side of her throat.        Past Medical History:  Diagnosis Date  . ADHD (attention deficit hyperactivity disorder) Dr.Steiner  . Anxiety Dr.Steiner  . Arthritis   . Decreased libido   . Depression Dr.Steiner  . Essential hypertension, benign  07/29/2012  . Onychomycosis   . Postmenopausal   . Scoliosis   . Scoliosis    degenerative   . Seborrheic dermatitis     Patient Active Problem List   Diagnosis Date Noted  . Major depressive disorder, single episode, moderate degree ([place]) 12/04/2016  . Osteopenia, last dexa 07/2012, Ca Vit D Exercise, repeat 2 years 07/29/2012  . Essential hypertension, benign  07/29/2012  . Vaginal bleeding - followed by Dr. Briant Sites ob/gyn 07/29/2012    Past Surgical History:  Procedure Laterality Date  . arthroscopic surgery shoulder  right, 1994(Dr.Rowan) bone spur removed     OB History    Gravida  2   Para  1   Term      Preterm      AB      Living        SAB      TAB      Ectopic      Multiple      Live Births              Family History  Problem Relation Age of Onset  . Thyroid disease Mother   . Arthritis Mother   . Heart disease Mother        CHF  . Hypertension Brother   . Arthritis Sister   . Arthritis Other        grandmother  . Hyperlipidemia Sister   . Heart disease Other        grandmother  . Stroke Other        grandfather  . Hypertension Brother     Social History   Tobacco Use  . Smoking status: Former Smoker    Packs/day: 0.20    Types: Cigarettes  . Smokeless tobacco: Never Used  Substance Use Topics  . Alcohol use: Yes  . Drug use: No    Home Medications Prior to Admission medications   Medication Sig Start Date End Date Taking? Authorizing Provider  amphetamine-dextroamphetamine (ADDERALL) 30 MG tablet Take 15 mg by mouth 2 (two) times daily.  [provider]  lisinopril (PRINIVIL,ZESTRIL) 5 MG tablet take 1 tablet by mouth once daily 08/08/15   Terressa Koyanagi, DO    Allergies    Antihistamines, chlorpheniramine-type  Review of Systems   Review of Systems  Constitutional: Negative for fever.  HENT: Positive for trouble swallowing. Negative for rhinorrhea and sore throat.   Eyes: Negative for redness.  Respiratory: Positive for chest tightness. Negative for cough and choking.   Cardiovascular: Negative for chest pain.  Gastrointestinal: Negative for abdominal pain, diarrhea, nausea and vomiting.  Genitourinary: Negative for dysuria.  Musculoskeletal: Negative for myalgias.  Skin: Negative for rash.  Neurological: Negative for headaches.    Physical Exam Updated Vital Signs BP (!) 155/90 (BP Location: Left Arm)   Pulse 86   Temp 98.7 F (37.1 C) (Oral)   Resp 16   Ht 5\' 5"  (1.651 m)   Wt 68 kg   SpO2 96%   BMI 24.96 kg/m   Physical Exam Vitals and nursing Harris reviewed.    Constitutional:      Appearance: She is well-developed.  HENT:     Head: Normocephalic and atraumatic.  Eyes:     General:        Right eye: No discharge.        Left eye: No discharge.     Conjunctiva/sclera: Conjunctivae normal.  Cardiovascular:     Rate and Rhythm: Normal rate and regular rhythm.     Heart sounds: Normal heart sounds.  Pulmonary:     Effort: Pulmonary effort is normal.     Breath sounds: Normal breath sounds.  Abdominal:     Palpations: Abdomen is soft.     Tenderness: There is no abdominal tenderness. There is no guarding or rebound.  Musculoskeletal:     Cervical back: Normal range of motion and neck supple.  Skin:    General: Skin is warm and dry.  Neurological:     Mental Status: She is alert.     ED Results / Procedures / Treatments   Labs (all labs ordered are listed, but only abnormal results are displayed) Labs Reviewed - No data to display  EKG None  Radiology No results found.  Procedures Procedures (including critical care time)  Medications Ordered in ED Medications - No data to display  ED Course  I have reviewed the triage vital signs and the nursing notes.  Pertinent labs & imaging results that were available during my care of the patient were reviewed by me and considered in my medical decision making (see chart for details).  Patient seen and examined. Currently she looks comfortable and is tolerating her secretions. Will attempt to give sips of water and advance to crackers.   Vital signs reviewed and are as follows: BP (!) 155/90 (BP Location: Left Arm)   Pulse 86   Temp 98.7 F (37.1 C) (Oral)   Resp 16   Ht 5\' 5"  (1.651 m)   Wt 68 kg   SpO2 96%   BMI 24.96 kg/m   9:13 AM I observed the patient taking a few sips of water.  She did not have any immediate regurgitation.  She did belch.  We will continue to have her sip water and advance as tolerated.  9:51 AM Called to patient's room.  She is fully dressed,  sitting on the edge of the bed, requesting discharge.  She has had crackers without any problems.  Discussed that she likely had an esophageal obstruction that is now cleared.  We discussed that it is very important her for her to follow-up with GI for further evaluation as this could happen again.  Encouraged return to the emergency department with recurrent symptoms, inability to swallow, new symptoms or other concerns.  Patient looks well and comfortable at time of discharge.  She is anxious to leave.    MDM Rules/Calculators/A&P                      Patient with signs and symptoms of esophageal obstruction last night that is now resolved.  Patient has passed fluid challenge and has eaten some crackers in the ED without regurgitation.  She is clear for discharged home at this point.  I am hopeful that the patient will follow up with GI referral for further evaluation and management.  We discussed that she could be developing narrowing in the esophagus and would also need to be checked to ensure she is not developing a tumor in her throat.    Final Clinical Impression(s) / ED Diagnoses Final diagnoses:  Esophageal obstruction due to food impaction    Rx / DC Orders ED Discharge Orders    None       Renne Crigler, PA-C 07/19/19 3419    Gerhard Munch, MD 07/19/19 [phone]

## 2019-08-10 ENCOUNTER — Emergency Department (HOSPITAL_COMMUNITY): Payer: Medicare Other

## 2019-08-10 ENCOUNTER — Inpatient Hospital Stay (HOSPITAL_COMMUNITY)
Admission: EM | Admit: 2019-08-10 | Discharge: 2019-09-05 | DRG: 064 | Disposition: E | Payer: Medicare Other | Attending: Internal Medicine | Admitting: Internal Medicine

## 2019-08-10 ENCOUNTER — Encounter (HOSPITAL_COMMUNITY): Payer: Self-pay | Admitting: Emergency Medicine

## 2019-08-10 ENCOUNTER — Other Ambulatory Visit: Payer: Self-pay

## 2019-08-10 DIAGNOSIS — G92 Toxic encephalopathy: Secondary | ICD-10-CM | POA: Diagnosis present

## 2019-08-10 DIAGNOSIS — I6200 Nontraumatic subdural hemorrhage, unspecified: Secondary | ICD-10-CM | POA: Diagnosis present

## 2019-08-10 DIAGNOSIS — Z8249 Family history of ischemic heart disease and other diseases of the circulatory system: Secondary | ICD-10-CM

## 2019-08-10 DIAGNOSIS — R29810 Facial weakness: Secondary | ICD-10-CM | POA: Diagnosis not present

## 2019-08-10 DIAGNOSIS — E876 Hypokalemia: Secondary | ICD-10-CM | POA: Diagnosis not present

## 2019-08-10 DIAGNOSIS — Z66 Do not resuscitate: Secondary | ICD-10-CM | POA: Diagnosis not present

## 2019-08-10 DIAGNOSIS — E43 Unspecified severe protein-calorie malnutrition: Secondary | ICD-10-CM | POA: Diagnosis present

## 2019-08-10 DIAGNOSIS — Z8349 Family history of other endocrine, nutritional and metabolic diseases: Secondary | ICD-10-CM | POA: Diagnosis not present

## 2019-08-10 DIAGNOSIS — E538 Deficiency of other specified B group vitamins: Secondary | ICD-10-CM | POA: Diagnosis present

## 2019-08-10 DIAGNOSIS — R4020 Unspecified coma: Secondary | ICD-10-CM | POA: Diagnosis not present

## 2019-08-10 DIAGNOSIS — Z20822 Contact with and (suspected) exposure to covid-19: Secondary | ICD-10-CM | POA: Diagnosis present

## 2019-08-10 DIAGNOSIS — M8580 Other specified disorders of bone density and structure, unspecified site: Secondary | ICD-10-CM | POA: Diagnosis present

## 2019-08-10 DIAGNOSIS — J9691 Respiratory failure, unspecified with hypoxia: Secondary | ICD-10-CM | POA: Diagnosis not present

## 2019-08-10 DIAGNOSIS — Z87891 Personal history of nicotine dependence: Secondary | ICD-10-CM | POA: Diagnosis not present

## 2019-08-10 DIAGNOSIS — Z83438 Family history of other disorder of lipoprotein metabolism and other lipidemia: Secondary | ICD-10-CM

## 2019-08-10 DIAGNOSIS — F321 Major depressive disorder, single episode, moderate: Secondary | ICD-10-CM | POA: Diagnosis present

## 2019-08-10 DIAGNOSIS — R45851 Suicidal ideations: Secondary | ICD-10-CM | POA: Diagnosis present

## 2019-08-10 DIAGNOSIS — Z6822 Body mass index (BMI) 22.0-22.9, adult: Secondary | ICD-10-CM

## 2019-08-10 DIAGNOSIS — G935 Compression of brain: Secondary | ICD-10-CM | POA: Diagnosis not present

## 2019-08-10 DIAGNOSIS — E871 Hypo-osmolality and hyponatremia: Secondary | ICD-10-CM | POA: Diagnosis present

## 2019-08-10 DIAGNOSIS — R29729 NIHSS score 29: Secondary | ICD-10-CM | POA: Diagnosis present

## 2019-08-10 DIAGNOSIS — M1990 Unspecified osteoarthritis, unspecified site: Secondary | ICD-10-CM | POA: Diagnosis present

## 2019-08-10 DIAGNOSIS — F1020 Alcohol dependence, uncomplicated: Secondary | ICD-10-CM | POA: Diagnosis present

## 2019-08-10 DIAGNOSIS — Z8261 Family history of arthritis: Secondary | ICD-10-CM

## 2019-08-10 DIAGNOSIS — Z515 Encounter for palliative care: Secondary | ICD-10-CM | POA: Diagnosis not present

## 2019-08-10 DIAGNOSIS — R2681 Unsteadiness on feet: Secondary | ICD-10-CM

## 2019-08-10 DIAGNOSIS — I10 Essential (primary) hypertension: Secondary | ICD-10-CM | POA: Diagnosis present

## 2019-08-10 DIAGNOSIS — M4180 Other forms of scoliosis, site unspecified: Secondary | ICD-10-CM | POA: Diagnosis present

## 2019-08-10 DIAGNOSIS — F329 Major depressive disorder, single episode, unspecified: Secondary | ICD-10-CM | POA: Diagnosis present

## 2019-08-10 DIAGNOSIS — F1010 Alcohol abuse, uncomplicated: Secondary | ICD-10-CM | POA: Diagnosis not present

## 2019-08-10 DIAGNOSIS — Z888 Allergy status to other drugs, medicaments and biological substances status: Secondary | ICD-10-CM

## 2019-08-10 DIAGNOSIS — F909 Attention-deficit hyperactivity disorder, unspecified type: Secondary | ICD-10-CM | POA: Diagnosis present

## 2019-08-10 DIAGNOSIS — S065XAA Traumatic subdural hemorrhage with loss of consciousness status unknown, initial encounter: Secondary | ICD-10-CM | POA: Diagnosis present

## 2019-08-10 DIAGNOSIS — F419 Anxiety disorder, unspecified: Secondary | ICD-10-CM | POA: Diagnosis present

## 2019-08-10 DIAGNOSIS — S065X9A Traumatic subdural hemorrhage with loss of consciousness of unspecified duration, initial encounter: Principal | ICD-10-CM

## 2019-08-10 DIAGNOSIS — I609 Nontraumatic subarachnoid hemorrhage, unspecified: Secondary | ICD-10-CM | POA: Diagnosis not present

## 2019-08-10 DIAGNOSIS — Z823 Family history of stroke: Secondary | ICD-10-CM

## 2019-08-10 DIAGNOSIS — E861 Hypovolemia: Secondary | ICD-10-CM | POA: Diagnosis present

## 2019-08-10 LAB — CBC WITH DIFFERENTIAL/PLATELET
Abs Immature Granulocytes: 0.04 10*3/uL (ref 0.00–0.07)
Basophils Absolute: 0 10*3/uL (ref 0.0–0.1)
Basophils Relative: 0 %
Eosinophils Absolute: 0 10*3/uL (ref 0.0–0.5)
Eosinophils Relative: 0 %
HCT: 41.1 % (ref 36.0–46.0)
Hemoglobin: 15.1 g/dL — ABNORMAL HIGH (ref 12.0–15.0)
Immature Granulocytes: 0 %
Lymphocytes Relative: 7 %
Lymphs Abs: 0.6 10*3/uL — ABNORMAL LOW (ref 0.7–4.0)
MCH: 34.8 pg — ABNORMAL HIGH (ref 26.0–34.0)
MCHC: 36.7 g/dL — ABNORMAL HIGH (ref 30.0–36.0)
MCV: 94.7 fL (ref 80.0–100.0)
Monocytes Absolute: 1 10*3/uL (ref 0.1–1.0)
Monocytes Relative: 11 %
Neutro Abs: 7.4 10*3/uL (ref 1.7–7.7)
Neutrophils Relative %: 82 %
Platelets: 165 10*3/uL (ref 150–400)
RBC: 4.34 MIL/uL (ref 3.87–5.11)
RDW: 12.8 % (ref 11.5–15.5)
WBC: 9.1 10*3/uL (ref 4.0–10.5)
nRBC: 0 % (ref 0.0–0.2)

## 2019-08-10 LAB — PROTIME-INR
INR: 0.9 (ref 0.8–1.2)
Prothrombin Time: 12.2 seconds (ref 11.4–15.2)

## 2019-08-10 LAB — COMPREHENSIVE METABOLIC PANEL
ALT: 173 U/L — ABNORMAL HIGH (ref 0–44)
AST: 83 U/L — ABNORMAL HIGH (ref 15–41)
Albumin: 4.4 g/dL (ref 3.5–5.0)
Alkaline Phosphatase: 72 U/L (ref 38–126)
Anion gap: 18 — ABNORMAL HIGH (ref 5–15)
BUN: 6 mg/dL — ABNORMAL LOW (ref 8–23)
CO2: 22 mmol/L (ref 22–32)
Calcium: 9.4 mg/dL (ref 8.9–10.3)
Chloride: 86 mmol/L — ABNORMAL LOW (ref 98–111)
Creatinine, Ser: 0.59 mg/dL (ref 0.44–1.00)
GFR calc Af Amer: >60 mL/min (ref >60)
GFR calc non Af Amer: >60 mL/min (ref >60)
Glucose, Bld: 134 mg/dL — ABNORMAL HIGH (ref 70–99)
Potassium: 3.3 mmol/L — ABNORMAL LOW (ref 3.5–5.1)
Sodium: 126 mmol/L — ABNORMAL LOW (ref 135–145)
Total Bilirubin: 2.1 mg/dL — ABNORMAL HIGH (ref 0.3–1.2)
Total Protein: 7.1 g/dL (ref 6.5–8.1)

## 2019-08-10 LAB — ETHANOL: Alcohol, Ethyl (B): <10 mg/dL (ref <10)

## 2019-08-10 LAB — BLOOD GAS, VENOUS
Acid-Base Excess: 1.3 mmol/L (ref 0.0–2.0)
Bicarbonate: 24.6 mmol/L (ref 20.0–28.0)
O2 Saturation: 37 %
Patient temperature: 98.6
pCO2, Ven: 36.4 mmHg — ABNORMAL LOW (ref 44.0–60.0)
pH, Ven: 7.444 — ABNORMAL HIGH (ref 7.250–7.430)

## 2019-08-10 LAB — FOLATE: Folate: 2.5 ng/mL — ABNORMAL LOW (ref >5.9)

## 2019-08-10 LAB — VITAMIN B12: Vitamin B-12: 201 pg/mL (ref 180–914)

## 2019-08-10 LAB — AMMONIA: Ammonia: 25 umol/L (ref 9–35)

## 2019-08-10 LAB — RESPIRATORY PANEL BY RT PCR (FLU A&B, COVID)
Influenza A by PCR: NEGATIVE
Influenza B by PCR: NEGATIVE
SARS Coronavirus 2 by RT PCR: NEGATIVE

## 2019-08-10 LAB — LIPASE, BLOOD: Lipase: 27 U/L (ref 11–51)

## 2019-08-10 LAB — TROPONIN I (HIGH SENSITIVITY): Troponin I (High Sensitivity): 20 ng/L — ABNORMAL HIGH (ref <18)

## 2019-08-10 LAB — LACTIC ACID, PLASMA: Lactic Acid, Venous: 1.8 mmol/L (ref 0.5–1.9)

## 2019-08-10 MED ORDER — IBUPROFEN 200 MG PO TABS: 600.0000 mg | ORAL_TABLET | Freq: Three times a day (TID) | ORAL | Status: DC | PRN

## 2019-08-10 MED ORDER — THIAMINE HCL 100 MG/ML IJ SOLN
Freq: Once | INTRAVENOUS | Status: AC
  Filled 2019-08-10: qty 1000

## 2019-08-10 MED ORDER — THIAMINE HCL 100 MG PO TABS
100.0000 mg | ORAL_TABLET | Freq: Every day | ORAL | Status: DC
  Administered 2019-08-11 – 2019-08-17 (×7): 100 mg via ORAL
  Filled 2019-08-10 (×7): qty 1

## 2019-08-10 MED ORDER — LISINOPRIL 10 MG PO TABS: 5.0000 mg | ORAL_TABLET | Freq: Every day | ORAL | Status: DC

## 2019-08-10 MED ORDER — LORAZEPAM 1 MG PO TABS: 2.0000 mg | ORAL_TABLET | Freq: Once | ORAL | Status: DC

## 2019-08-10 MED ORDER — ONDANSETRON HCL 4 MG PO TABS
4.0000 mg | ORAL_TABLET | Freq: Three times a day (TID) | ORAL | Status: DC | PRN
  Administered 2019-08-14: 4 mg via ORAL
  Filled 2019-08-10: qty 1

## 2019-08-10 MED ORDER — THIAMINE HCL 100 MG/ML IJ SOLN
100.0000 mg | Freq: Every day | INTRAMUSCULAR | Status: DC
  Administered 2019-08-10: 100 mg via INTRAVENOUS
  Filled 2019-08-10 (×2): qty 2

## 2019-08-10 MED ORDER — LORAZEPAM 2 MG/ML IJ SOLN: 0.0000 mg | Freq: Two times a day (BID) | INTRAMUSCULAR | Status: DC

## 2019-08-10 MED ORDER — LORAZEPAM 1 MG PO TABS: 0.0000 mg | ORAL_TABLET | Freq: Four times a day (QID) | ORAL | Status: DC

## 2019-08-10 MED ORDER — LORAZEPAM 2 MG/ML IJ SOLN: 0.0000 mg | Freq: Four times a day (QID) | INTRAMUSCULAR | Status: DC

## 2019-08-10 MED ORDER — LORAZEPAM 1 MG PO TABS: 0.0000 mg | ORAL_TABLET | Freq: Two times a day (BID) | ORAL | Status: DC

## 2019-08-10 MED ORDER — PANTOPRAZOLE SODIUM 20 MG PO TBEC: 20.0000 mg | DELAYED_RELEASE_TABLET | Freq: Once | ORAL | Status: DC

## 2019-08-10 NOTE — ED Triage Notes (Signed)
Patient reports anxiety "for at least a week." Requesting anxiety medication stating "I am almost out." Hx depression. Friend at bedside reports patient has poor diet. Friend also reports patient is chronically SI, though patient denies at this time.

## 2019-08-10 NOTE — ED Notes (Signed)
Patient wanded by security. 

## 2019-08-10 NOTE — ED Notes (Signed)
1 pt belonging bag at nurses station

## 2019-08-10 NOTE — ED Provider Notes (Signed)
[facility]-EMERGENCY DEPT Provider Note   CSN: [id number] Arrival date & time: 08-25-2019  1834     History Chief Complaint  Patient presents with  . Anxiety    Olivia Harris is a 69 y.o. female.  HPI Patient reported she was just here to get a refill on her anxiety medications.  She seemed distracted and confused.  Patient could not name the medication that she needed to have refilled.  She produced a bottle with several types of medication broken into smaller pieces.  The bottle had no markings on it.  She was sifting through the contents of her purse looking for something but seemingly unable to focus.  I tried ambulating the patient and her gait was very unstable.  Patient insisted that she just needed to get her medication and leave.  I contacted the friend who brought the patient to the emergency department.  He gave extensive history.  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  Baseline she drives and is competent for all of her normal activities of daily living.  He however reports that she likely has fairly heavy alcohol use.  He estimates possibly 2-3 bottles of wine a day.  He reports she buys 3-4 cases of wine at a time.  They have been friends for 8 to 10 years.  He reports she comes over to his house around 11 in the morning and leaves at about 7 or 8 at night.  He reports her normal diet is pizza which she eats 3 times a day to the exclusion of any other food.  He reports 3 days ago something changed.  She became very slowed and confused.  At times she seemed almost catatonic.  He reports this evening it took her almost 10 minutes to walk to the car and she seemed extremely slow to respond.  He reports this is very atypical for her.  He does reports that for years she has been threatening to kill herself.  She has said that she has everything ready.  He reports that recently she did say that she bought a gun and that she reported she has  been practicing with it.  Patient has a son who lives in [place].  He reports they talk on the phone intermittently.  Patient has not had her Covid vaccine.    Past Medical History:  Diagnosis Date  . ADHD (attention deficit hyperactivity disorder) Dr.Steiner  . Anxiety Dr.Steiner  . Arthritis   . Decreased libido   . Depression Dr.Steiner  . Essential hypertension, benign  07/29/2012  . Onychomycosis   . Postmenopausal   . Scoliosis   . Scoliosis    degenerative   . Seborrheic dermatitis     Patient Active Problem List   Diagnosis Date Noted  . Alcohol abuse 08/11/2019  . Hyponatremia 08/11/2019  . Subdural hematoma (HCC) 2019-08-25  . Major depressive disorder, single episode, moderate degree (HCC) 12/04/2016  . Osteopenia, last dexa 07/2012, Ca Vit D Exercise, repeat 2 years 07/29/2012  . Essential hypertension, benign  07/29/2012  . Vaginal bleeding - followed by Dr. Nikki Dom ob/gyn 07/29/2012    Past Surgical History:  Procedure Laterality Date  . arthroscopic surgery shoulder  right, 1994(Dr.Rowan) bone spur removed     OB History    Gravida  2   Para  1   Term      Preterm      AB      Living  SAB      TAB      Ectopic      Multiple      Live Births              Family History  Problem Relation Age of Onset  . Thyroid disease Mother   . Arthritis Mother   . Heart disease Mother        CHF  . Hypertension Brother   . Arthritis Sister   . Arthritis Other        grandmother  . Hyperlipidemia Sister   . Heart disease Other        grandmother  . Stroke Other        grandfather  . Hypertension Brother     Social History   Tobacco Use  . Smoking status: Former Smoker    Packs/day: 0.20    Types: Cigarettes  . Smokeless tobacco: Never Used  Substance Use Topics  . Alcohol use: Yes  . Drug use: No    Home Medications Prior to Admission medications   Medication Sig Start Date End Date Taking? Authorizing Provider    lisinopril (PRINIVIL,ZESTRIL) 5 MG tablet take 1 tablet by mouth once daily Patient not taking: Reported on 08/25/2019 08/08/15   Terressa KoyanagiKim, Hannah R, DO    Allergies    Antihistamines, chlorpheniramine-type  Review of Systems   Review of Systems 10 Systems reviewed and are negative for acute change except as noted in the HPI. Physical Exam Updated Vital Signs BP (!) 149/96   Pulse (!) 106   Temp 98.4 F (36.9 C) (Oral)   Resp 18   SpO2 95%   Physical Exam Constitutional:      Comments: At first exam the patient is alert.  She is looking at different pieces of paper and cards from her purse.  She seems slightly confused by them.  No respiratory distress.  HENT:     Head: Normocephalic and atraumatic.     Nose: Nose normal.     Mouth/Throat:     Mouth: Mucous membranes are moist.     Pharynx: Oropharynx is clear.  Eyes:     Extraocular Movements: Extraocular movements intact.     Conjunctiva/sclera: Conjunctivae normal.     Pupils: Pupils are equal, round, and reactive to light.  Cardiovascular:     Rate and Rhythm: Normal rate and regular rhythm.  Pulmonary:     Effort: Pulmonary effort is normal.     Breath sounds: Normal breath sounds.  Abdominal:     General: There is no distension.     Palpations: Abdomen is soft.     Tenderness: There is no abdominal tenderness. There is no guarding.  Musculoskeletal:        General: Normal range of motion.     Cervical back: Neck supple.     Comments: Patient has a fairly large bruise on her upper arm as well as on the side of her hand.  Bruises on both knees.  Normal range of motion without deformities of the extremities.  Skin:    General: Skin is warm and dry.  Neurological:     Comments: Patient seems slightly confused.  She has a mild tremor.  She follows commands for grip strength and pushing with the extremities.  Patient was able to answer name and place.  She made a mistake initially with the year however corrected that.  In  attempting to ambulate the patient she is very ataxic.  Gait is  somewhat broad-based and she is listing forward appears great risk for fall.  Psychiatric:     Comments: Patient is mildly anxious.  She is insistent on leaving.     ED Results / Procedures / Treatments   Labs (all labs ordered are listed, but only abnormal results are displayed) Labs Reviewed  COMPREHENSIVE METABOLIC PANEL - Abnormal; Notable for the following components:      Result Value   Sodium 126 (*)    Potassium 3.3 (*)    Chloride 86 (*)    Glucose, Bld 134 (*)    BUN 6 (*)    AST 83 (*)    ALT 173 (*)    Total Bilirubin 2.1 (*)    Anion gap 18 (*)    All other components within normal limits  CBC WITH DIFFERENTIAL/PLATELET - Abnormal; Notable for the following components:   Hemoglobin 15.1 (*)    MCH 34.8 (*)    MCHC 36.7 (*)    Lymphs Abs 0.6 (*)    All other components within normal limits  BLOOD GAS, VENOUS - Abnormal; Notable for the following components:   pH, Ven 7.444 (*)    pCO2, Ven 36.4 (*)    All other components within normal limits  FOLATE - Abnormal; Notable for the following components:   Folate 2.5 (*)    All other components within normal limits  TROPONIN I (HIGH SENSITIVITY) - Abnormal; Notable for the following components:   Troponin I (High Sensitivity) 20 (*)    All other components within normal limits  TROPONIN I (HIGH SENSITIVITY) - Abnormal; Notable for the following components:   Troponin I (High Sensitivity) 21 (*)    All other components within normal limits  RESPIRATORY PANEL BY RT PCR (FLU A&B, COVID)  ETHANOL  LIPASE, BLOOD  LACTIC ACID, PLASMA  PROTIME-INR  AMMONIA  VITAMIN B12  URINALYSIS, ROUTINE W REFLEX MICROSCOPIC  RAPID URINE DRUG SCREEN, HOSP PERFORMED  VITAMIN B6  BASIC METABOLIC PANEL  BASIC METABOLIC PANEL  BASIC METABOLIC PANEL    EKG EKG Interpretation  Date/Time:  Thursday Aug 10 2019 22:03:21 EDT Ventricular Rate:  117 PR Interval:      QRS Duration: 93 QT Interval:  306 QTC Calculation: 427 R Axis:   -64 Text Interpretation: Sinus tachycardia Ventricular premature complex Inferior infarct, old Anteroseptal infarct, age indeterminate Baseline wander in lead(s) V6 No significant change was found Confirmed by Paula Libra ([id number]) on 08/11/2019 12:07:08 AM   Radiology CT Head Wo Contrast  Result Date: 08/28/2019 CLINICAL DATA:  Confusion, altered mental status EXAM: CT HEAD WITHOUT CONTRAST TECHNIQUE: Contiguous axial images were obtained from the base of the skull through the vertex without intravenous contrast. COMPARISON:  None. FINDINGS: Brain: Crescentic, hyperdense subdural hemorrhage seen extending across the left frontal and parietal convexities with additional hyperdense hemorrhage seen layering along the left tentorium and along the left falx as well. There is fairly global sulcal effacement of the left cerebral hemisphere with some a minimal left-to-right midline shift approximately 4 mm best seen on coronal imaging 5/22. No other sites of hemorrhage. No CT evidence of large vascular territory infarct. Patchy areas of white matter hypoattenuation are most compatible with chronic microvascular angiopathy. Symmetric prominence of the ventricles, cisterns and sulci compatible with parenchymal volume loss. Vascular: No hyperdense vessel. Skull: No calvarial fracture or suspicious osseous lesion. No scalp swelling or hematoma. Sinuses/Orbits: Paranasal sinuses and mastoid air cells are predominantly clear. Pneumatization of the petrous apices. Included orbital structures  are unremarkable. Other: None IMPRESSION: 1. Crescentic, hyperdense subdural hemorrhage extending across the cerebral hemisphere most pronounced over the left frontal and temporal lobes, along the falx and left tentorium. 2. Mild mass effect with global sulcal effacement and left-to-right midline shift of 4 mm. 3. No CT evidence of large vascular territory infarct. 4.  Chronic microvascular angiopathy and parenchymal volume loss. Critical Value/emergent results were called by telephone at the time of interpretation on 08/14/2019 at 9:55 pm to provider [facility] , who verbally acknowledged these results. Electronically Signed   By: Kreg Shropshire M.D.   On: 08/28/2019 21:55    Procedures Procedures (including critical care time) CRITICAL CARE Performed by: Arby Barrette   Total critical care time: 30 minutes  Critical care time was exclusive of separately billable procedures and treating other patients.  Critical care was necessary to treat or prevent imminent or life-threatening deterioration.  Critical care was time spent personally by me on the following activities: development of treatment plan with patient and/or surrogate as well as nursing, discussions with consultants, evaluation of patient's response to treatment, examination of patient, obtaining history from patient or surrogate, ordering and performing treatments and interventions, ordering and review of laboratory studies, ordering and review of radiographic studies, pulse oximetry and re-evaluation of patient's condition. Medications Ordered in ED Medications  thiamine tablet 100 mg ( Oral See Alternative 08/25/2019 2250)    Or  thiamine (B-1) injection 100 mg (100 mg Intravenous Given 08/11/2019 2250)  ondansetron (ZOFRAN) tablet 4 mg (has no administration in time range)  lactated ringers infusion (has no administration in time range)  folic acid 1 mg in sodium chloride 0.9 % 50 mL IVPB (has no administration in time range)  multivitamin with minerals tablet 1 tablet (has no administration in time range)  sodium chloride 0.9 % 1,000 mL with thiamine 100 mg, folic acid 1 mg, multivitamins adult 10 mL infusion ( Intravenous New Bag/Given 08/05/2019 2244)    ED Course  I have reviewed the triage vital signs and the nursing notes.  Pertinent labs & imaging results that were available during my care  of the patient were reviewed by me and considered in my medical decision making (see chart for details).    MDM Rules/Calculators/A&P                      Consult: Reviewed with Dr. Mikal Plane.  Advises for admission to medical service and transfer to [facility] facility.  No immediate interventions anticipated. Consult: Hospitalist service for admission.  Patient presents as outlined above.  Report from her companion is that she has relatively heavy daily alcohol use.  There was no known fall or traumatic history.  CT head however shows subdural hematoma.  Even the bruises on the patient's right arm and right leg, it appears likely that she sustained a fall.  Patient does have significant alcohol consumption.  There is minor tremor and slight agitation.  Patient is redirectable and is situationally oriented.  Plan for admission.  Also, patient reportedly has had chronic passive suicidal ideations.  Her close companion however reports that she more recently has stated that she actually bought a gun and has been doing some practicing with it.  At this time in addition to medical risk of subdural hematoma, I feel there is risk of potential self injury due to depression and alcohol abuse.  Patient is admitted under IVC conditions until she is appropriately medically cleared for full psychiatric evaluation.  Patient did not want me to call her son to update him on her current medical conditions.Due to the patient's request I have not done that.  His phone number is not listed on the medical record.  Patient has a cell phone but would not identify the number in the contacts for me.  Barnabas Lister is the patient's main companion.  He advises he is available for contact at all times.  [phone].  Final Clinical Impression(s) / ED Diagnoses Final diagnoses:  Subdural hematoma ([place])  Alcohol abuse  Gait instability  Suicidal ideation    Rx / DC Orders ED Discharge Orders    None       Charlesetta Shanks,  MD 08/11/19 [phone]

## 2019-08-11 ENCOUNTER — Inpatient Hospital Stay (HOSPITAL_COMMUNITY): Payer: Medicare Other

## 2019-08-11 DIAGNOSIS — E871 Hypo-osmolality and hyponatremia: Secondary | ICD-10-CM

## 2019-08-11 DIAGNOSIS — F1010 Alcohol abuse, uncomplicated: Secondary | ICD-10-CM

## 2019-08-11 LAB — TROPONIN I (HIGH SENSITIVITY): Troponin I (High Sensitivity): 21 ng/L — ABNORMAL HIGH (ref <18)

## 2019-08-11 LAB — BASIC METABOLIC PANEL
Anion gap: 16 — ABNORMAL HIGH (ref 5–15)
Anion gap: 14 (ref 5–15)
Anion gap: 14 (ref 5–15)
BUN: 5 mg/dL — ABNORMAL LOW (ref 8–23)
BUN: 5 mg/dL — ABNORMAL LOW (ref 8–23)
BUN: 7 mg/dL — ABNORMAL LOW (ref 8–23)
CO2: 21 mmol/L — ABNORMAL LOW (ref 22–32)
CO2: 21 mmol/L — ABNORMAL LOW (ref 22–32)
CO2: 21 mmol/L — ABNORMAL LOW (ref 22–32)
Calcium: 8.5 mg/dL — ABNORMAL LOW (ref 8.9–10.3)
Calcium: 8.2 mg/dL — ABNORMAL LOW (ref 8.9–10.3)
Calcium: 8.7 mg/dL — ABNORMAL LOW (ref 8.9–10.3)
Chloride: 91 mmol/L — ABNORMAL LOW (ref 98–111)
Chloride: 94 mmol/L — ABNORMAL LOW (ref 98–111)
Chloride: 93 mmol/L — ABNORMAL LOW (ref 98–111)
Creatinine, Ser: 0.52 mg/dL (ref 0.44–1.00)
Creatinine, Ser: 0.47 mg/dL (ref 0.44–1.00)
Creatinine, Ser: 0.61 mg/dL (ref 0.44–1.00)
GFR calc Af Amer: >60 mL/min (ref >60)
GFR calc Af Amer: >60 mL/min (ref >60)
GFR calc Af Amer: >60 mL/min (ref >60)
GFR calc non Af Amer: >60 mL/min (ref >60)
GFR calc non Af Amer: >60 mL/min (ref >60)
GFR calc non Af Amer: >60 mL/min (ref >60)
Glucose, Bld: 114 mg/dL — ABNORMAL HIGH (ref 70–99)
Glucose, Bld: 105 mg/dL — ABNORMAL HIGH (ref 70–99)
Glucose, Bld: 99 mg/dL (ref 70–99)
Potassium: 3.1 mmol/L — ABNORMAL LOW (ref 3.5–5.1)
Potassium: 3 mmol/L — ABNORMAL LOW (ref 3.5–5.1)
Potassium: 3.5 mmol/L (ref 3.5–5.1)
Sodium: 128 mmol/L — ABNORMAL LOW (ref 135–145)
Sodium: 129 mmol/L — ABNORMAL LOW (ref 135–145)
Sodium: 128 mmol/L — ABNORMAL LOW (ref 135–145)

## 2019-08-11 LAB — MAGNESIUM: Magnesium: 1.7 mg/dL (ref 1.7–2.4)

## 2019-08-11 LAB — PHOSPHORUS: Phosphorus: 2.4 mg/dL — ABNORMAL LOW (ref 2.5–4.6)

## 2019-08-11 MED ORDER — ACETAMINOPHEN 650 MG RE SUPP: 650.0000 mg | RECTAL | Status: DC | PRN

## 2019-08-11 MED ORDER — LABETALOL HCL 5 MG/ML IV SOLN: 10.0000 mg | INTRAVENOUS | Status: DC | PRN

## 2019-08-11 MED ORDER — LABETALOL HCL 5 MG/ML IV SOLN
20.0000 mg | Freq: Once | INTRAVENOUS | Status: AC
  Administered 2019-08-11: 20 mg via INTRAVENOUS
  Filled 2019-08-11: qty 4

## 2019-08-11 MED ORDER — FLUOXETINE HCL 20 MG PO CAPS
20.0000 mg | ORAL_CAPSULE | Freq: Every day | ORAL | Status: DC
  Administered 2019-08-11 – 2019-08-12 (×2): 20 mg via ORAL
  Filled 2019-08-11 (×2): qty 1

## 2019-08-11 MED ORDER — LEVETIRACETAM 500 MG PO TABS
500.0000 mg | ORAL_TABLET | Freq: Two times a day (BID) | ORAL | Status: DC
  Administered 2019-08-11 – 2019-08-17 (×12): 500 mg via ORAL
  Filled 2019-08-11 (×12): qty 1

## 2019-08-11 MED ORDER — POTASSIUM CHLORIDE CRYS ER 20 MEQ PO TBCR
40.0000 meq | EXTENDED_RELEASE_TABLET | Freq: Two times a day (BID) | ORAL | Status: AC
  Administered 2019-08-11 – 2019-08-12 (×4): 40 meq via ORAL
  Filled 2019-08-11 (×3): qty 2
  Filled 2019-08-11: qty 4

## 2019-08-11 MED ORDER — ACETAMINOPHEN 325 MG PO TABS
650.0000 mg | ORAL_TABLET | ORAL | Status: DC | PRN
  Administered 2019-08-15 – 2019-08-16 (×2): 650 mg via ORAL
  Filled 2019-08-11 (×2): qty 2

## 2019-08-11 MED ORDER — PANTOPRAZOLE SODIUM 40 MG IV SOLR
40.0000 mg | Freq: Every day | INTRAVENOUS | Status: DC
  Administered 2019-08-11 – 2019-08-12 (×2): 40 mg via INTRAVENOUS
  Filled 2019-08-11 (×2): qty 40

## 2019-08-11 MED ORDER — LACTATED RINGERS IV SOLN: INTRAVENOUS | Status: DC

## 2019-08-11 MED ORDER — SODIUM CHLORIDE 0.9 % IV SOLN
1.0000 mg | Freq: Every day | INTRAVENOUS | Status: DC
  Administered 2019-08-11 – 2019-08-13 (×2): 1 mg via INTRAVENOUS
  Filled 2019-08-11 (×3): qty 0.2

## 2019-08-11 MED ORDER — NICARDIPINE HCL IN NACL 20-0.86 MG/200ML-% IV SOLN
0.0000 mg/h | INTRAVENOUS | Status: DC
  Filled 2019-08-11: qty 200

## 2019-08-11 MED ORDER — STROKE: EARLY STAGES OF RECOVERY BOOK
Freq: Once | Status: AC
  Filled 2019-08-11 (×2): qty 1

## 2019-08-11 MED ORDER — LABETALOL HCL 5 MG/ML IV SOLN: 20.0000 mg | Freq: Once | INTRAVENOUS | Status: DC

## 2019-08-11 MED ORDER — SODIUM CHLORIDE 0.9 % IV SOLN: INTRAVENOUS | Status: DC

## 2019-08-11 MED ORDER — SENNOSIDES-DOCUSATE SODIUM 8.6-50 MG PO TABS
1.0000 | ORAL_TABLET | Freq: Two times a day (BID) | ORAL | Status: DC
  Administered 2019-08-11 – 2019-08-17 (×11): 1 via ORAL
  Filled 2019-08-11 (×11): qty 1

## 2019-08-11 MED ORDER — HYDRALAZINE HCL 10 MG PO TABS
10.0000 mg | ORAL_TABLET | Freq: Four times a day (QID) | ORAL | Status: DC
  Administered 2019-08-11 – 2019-08-17 (×24): 10 mg via ORAL
  Filled 2019-08-11 (×24): qty 1

## 2019-08-11 MED ORDER — ACETAMINOPHEN 160 MG/5ML PO SOLN: 650.0000 mg | ORAL | Status: DC | PRN

## 2019-08-11 MED ORDER — ADULT MULTIVITAMIN W/MINERALS CH
1.0000 | ORAL_TABLET | Freq: Every day | ORAL | Status: DC
  Administered 2019-08-12 – 2019-08-17 (×6): 1 via ORAL
  Filled 2019-08-11 (×6): qty 1

## 2019-08-11 NOTE — Hospital Course (Addendum)
Olivia Harris is a 69 yo CF with PMH alcohol abuse (u/k amount or last drink), HTN, depression, anxiety, ADHD who presents to the ER with complaints of "anxiety".  History was partially obtained from the patient as well as chart review.  ED provider also spoke with the patient's significant other, [name], at home on the phone. Patient reportedly consumes multiple drinks of wine nightly.  She was unable to state when her last drink was and how much.  She does state that she has fallen recently but is unable to provide details regarding when or how she fell.  When asked if she was involved in a fights or any trauma she did quickly deny this.  Her affect is very odd during interview and she requires several seconds in order to think of answers.  She was however unable to fully partake in the neuro exam particularly assessing for paresthesias.  She seemed very confused by the questions.  On evaluation in the ER she underwent CT head which revealed "Crescentic, hyperdense subdural hemorrhage extending across the cerebral hemisphere most pronounced over the left frontal and temporal lobes, along the falx and left tentorium.  Mild mass effect with global sulcal effacement and left-to-right midline shift of 4 mm." Her case was discussed with neurology, Dr. Franky Macho and she was recommended for transfer to [facility]. No urgent invasive intervention was warranted.   Of note, patient was also placed under an IVC by ED provider.  The patient had endorsed wishes of trying to go home and per her friend, [name] she has a history of suicidal ideation and has recently bought a gun and been practicing how to use it.  Lab workup notable for: Na 126, K 3.3, Cl 86, AST 83, ALT 173, TB 2.1 Ethanol <10, Lipase 27, Lactic 1.8, Trop 20, Ammonia 25 WBC 9.1, Hgb 15.1, HCT 41.1, PLTC 165 B12 201, Folate 2.5

## 2019-08-11 NOTE — ED Notes (Addendum)
[employer] made aware of need for IVC transport with [facility]

## 2019-08-11 NOTE — Consult Note (Signed)
Telepsych Consultation   Reason for Consult:  IVC by ER Referring Physician:  Dr. Jerral RalphGhimire Location of Patient: [phone] Location of Provider: [facility]  Patient Identification: Olivia Harris MRN:  [id number] Principal Diagnosis: Subdural hematoma (HCC) Diagnosis:  Principal Problem:   Subdural hematoma (HCC) Active Problems:   Alcohol abuse   Hyponatremia   Total Time spent with patient: 1 hour  Subjective:   Olivia Harris is a 69 y.o. female patient admitted with alcohol abuse, hypertension, depression, anxiety and suicidal thoughts. Patient originally reported this upon entering the ER, however on this day denies the above. She is calm and cooperative, although she does not appear to be forthcoming of her condition. Patient is unable to recall the reason for her presenting to the hospital. Provider offered patient her reason for admission and she said it was for her anxiety. " I came to get my medicine Alprazolam. I been taking it for a couple years. " Patient is unable to recall who prescribed that medications for her. She remains vague throughout the assessment and most of her answers are "I dont know, I cant/dont remember, a little bit. " When assessing psychiatric history she denies previous diagnoses, inpatient treatment, outpatient treatment, psych meds, or attempts. Writer then inquired about alprazolam use, and patient reports " I dont know". She again fails to provide information for outpatient provider or PCP, the dosage of alprazolam, frequency and duration. She originally denies substance abuse, then repeorts she drinks about 1-2 glasses of wine for the past couple years. She denies any legal history, probation. She denies any family history of mental illness.   HPI:  Olivia Harris is a 69 yo CF with PMH alcohol abuse (u/k amount or last drink), HTN, depression, anxiety, ADHD who presents to the ER with complaints of "anxiety".  History was partially obtained  from the patient as well as chart review.  ED provider also spoke with the patient's significant other, Ree KidaJack, at home on the phone. Patient reportedly consumes multiple drinks of wine nightly.  She was unable to state when her last drink was and how much.  She does state that she has fallen recently but is unable to provide details regarding when or how she fell.  When asked if she was involved in a fights or any trauma she did quickly deny this.  Her affect is very odd during interview and she requires several seconds in order to think of answers.  She was however unable to fully partake in the neuro exam particularly assessing for paresthesias.  She seemed very confused by the questions.   As per Ree KidaJack he and the patient have been friends for many years. Patient could be heard throughout recanting some of her statement and becoming confrontational with Ree KidaJack regarding her alcohol use and suicidality. " Her physical health has been on the decline has been more apparent here recently due to her alcohol use. Ree KidaJack later confirmed matter fact that patient buys wine by the cases and drinks about 2-3 cases per week. He is unaware how many bottles come in a case. He later narrows it down to about 5-6 bottles per day. He states for the past 7-8 years patient has been talking to him about suicide. " She once said that you have to have a lot of nerve to commit suicide and I dont have the nerve yet. I dont know if she has found the nerve to do it yet. But I am concerned. She recently bought  a gun. " Patient did not detest or deny the last statement. [name] said " I guess Im in trouble now. I may not be welcomed here anymore. "   During the evaluation patient is alert and oriented x3. She originally told me that her date of birth was 05/24/1954, upon her stating this her nurse who remained at the bedside corrected her and provided her right date of birth. Patient was able to identify todays date. She endorsed anxiety, and  her affect was congruent, constricted. She was not forthcoming with much information, very vague and slow to respond with most questions. Patient reported an active prescription for alprazolam which could not be verified by PMDP. Patient stated it was an active prescription and prescribed in the state of [place]. During the evaluation patients friend [name] entered the room and consent was obtained to talk with him. She lives alone and works part time as a Psychologist, sport and exercise. SHe has one child who lives in [place]. She denies suicidal ideations at this time. At this time patient with history of depression, anxiety, and alcohol abuse, and unable to confirm how much she drinks. She has difficulty recalling her reason for coming to the emergency room yet states that she has chronic suicidal thoughts. As per her friend [name] she recently bought a gun and her physical health has been on a decline. Due to risk factors at this time will recommend inpatient. Patient also remains a risk to her self and continues to meet inpatient criteria. Prior to discharge will need to have home secured and gun removed from the home.   Past Psychiatric History:   Risk to Self:  Yes Risk to Others:  No Prior Inpatient Therapy:   No Prior Outpatient Therapy:  Yes  Past Medical History:  Past Medical History:  Diagnosis Date  . ADHD (attention deficit hyperactivity disorder) Dr.Steiner  . Anxiety Dr.Steiner  . Arthritis   . Decreased libido   . Depression Dr.Steiner  . Essential hypertension, benign  07/29/2012  . Onychomycosis   . Postmenopausal   . Scoliosis   . Scoliosis    degenerative   . Seborrheic dermatitis     Past Surgical History:  Procedure Laterality Date  . arthroscopic surgery shoulder  right, 1994(Dr.Rowan) bone spur removed   Family History:  Family History  Problem Relation Age of Onset  . Thyroid disease Mother   . Arthritis Mother   . Heart disease Mother        CHF  . Hypertension Brother   .  Arthritis Sister   . Arthritis Other        grandmother  . Hyperlipidemia Sister   . Heart disease Other        grandmother  . Stroke Other        grandfather  . Hypertension Brother    Family Psychiatric  History:Denies Social History:  Social History   Substance and Sexual Activity  Alcohol Use Yes     Social History   Substance and Sexual Activity  Drug Use No    Social History   Socioeconomic History  . Marital status: Single    Spouse name: Not on file  . Number of children: Not on file  . Years of education: Not on file  . Highest education level: Not on file  Occupational History  . Not on file  Tobacco Use  . Smoking status: Former Smoker    Packs/day: 0.20    Types: Cigarettes  . Smokeless tobacco: Never  Used  Substance and Sexual Activity  . Alcohol use: Yes  . Drug use: No  . Sexual activity: Not on file  Other Topics Concern  . Not on file  Social History Narrative  . Not on file   Social Determinants of Health   Financial Resource Strain:   . Difficulty of Paying Living Expenses:   Food Insecurity:   . Worried About Programme researcher, broadcasting/film/video in the Last Year:   . Barista in the Last Year:   Transportation Needs:   . Freight forwarder (Medical):   [name] Lack of Transportation (Non-Medical):   Physical Activity:   . Days of Exercise per Week:   . Minutes of Exercise per Session:   Stress:   . Feeling of Stress :   Social Connections:   . Frequency of Communication with Friends and Family:   . Frequency of Social Gatherings with Friends and Family:   . Attends Religious Services:   . Active Member of Clubs or Organizations:   . Attends Banker Meetings:   [name] Marital Status:    Additional Social History:    Allergies:   Allergies  Allergen Reactions  . Antihistamines, Chlorpheniramine-Type Other (See Comments)    hyperactivity    Labs:  Results for orders placed or performed during the hospital encounter of  09-07-19 (from the past 48 hour(s))  Comprehensive metabolic panel     Status: Abnormal   Collection Time: 07-Sep-2019 10:16 PM  Result Value Ref Range   Sodium 126 (L) 135 - 145 mmol/L   Potassium 3.3 (L) 3.5 - 5.1 mmol/L   Chloride 86 (L) 98 - 111 mmol/L   CO2 22 22 - 32 mmol/L   Glucose, Bld 134 (H) 70 - 99 mg/dL    Comment: Glucose reference range applies only to samples taken after fasting for at least 8 hours.   BUN 6 (L) 8 - 23 mg/dL   Creatinine, Ser 0.35 0.44 - 1.00 mg/dL   Calcium 9.4 8.9 - 46.5 mg/dL   Total Protein 7.1 6.5 - 8.1 g/dL   Albumin 4.4 3.5 - 5.0 g/dL   AST 83 (H) 15 - 41 U/L   ALT 173 (H) 0 - 44 U/L   Alkaline Phosphatase 72 38 - 126 U/L   Total Bilirubin 2.1 (H) 0.3 - 1.2 mg/dL   GFR calc non Af Amer >60 >60 mL/min   GFR calc Af Amer >60 >60 mL/min   Anion gap 18 (H) 5 - 15    Comment: Performed at [facility], [address] [place], [place] [address]  Ethanol     Status: None   Collection Time: 07-Sep-2019 10:16 PM  Result Value Ref Range   Alcohol, Ethyl (B) <10 <10 mg/dL    Comment: (NOTE) Lowest detectable limit for serum alcohol is 10 mg/dL. For medical purposes only. Performed at [facility], [address] [place], [place] [address]   Lipase, blood     Status: None   Collection Time: Sep 07, 2019 10:16 PM  Result Value Ref Range   Lipase 27 11 - 51 U/L    Comment: Performed at [facility], [address], [place], [place] [address]  Lactic acid, plasma     Status: None   Collection Time: 09-07-19 10:16 PM  Result Value Ref Range   Lactic Acid, Venous 1.8 0.5 - 1.9 mmol/L    Comment: Performed at [facility], [address] [place], [place] [address]  Troponin I (High Sensitivity)     Status: Abnormal   Collection Time: Sep 08, 2019 10:16 PM  Result Value Ref Range   Troponin I (High Sensitivity) 20 (H) <18 ng/L    Comment: (NOTE) Elevated high sensitivity troponin I  (hsTnI) values and significant  changes across serial measurements may suggest ACS but many other  chronic and acute conditions are known to elevate hsTnI results.  Refer to the "Links" section for chest pain algorithms and additional  guidance. Performed at [facility], [address], [place], [place] [address]   CBC with Differential     Status: Abnormal   Collection Time: 2019/09/08 10:16 PM  Result Value Ref Range   WBC 9.1 4.0 - 10.5 K/uL   RBC 4.34 3.87 - 5.11 MIL/uL   Hemoglobin 15.1 (H) 12.0 - 15.0 g/dL   HCT 09.8 11.9 - 14.7 %   MCV 94.7 80.0 - 100.0 fL   MCH 34.8 (H) 26.0 - 34.0 pg   MCHC 36.7 (H) 30.0 - 36.0 g/dL   RDW 82.9 56.2 - 13.0 %   Platelets 165 150 - 400 K/uL   nRBC 0.0 0.0 - 0.2 %   Neutrophils Relative % 82 %   Neutro Abs 7.4 1.7 - 7.7 K/uL   Lymphocytes Relative 7 %   Lymphs Abs 0.6 (L) 0.7 - 4.0 K/uL   Monocytes Relative 11 %   Monocytes Absolute 1.0 0.1 - 1.0 K/uL   Eosinophils Relative 0 %   Eosinophils Absolute 0.0 0.0 - 0.5 K/uL   Basophils Relative 0 %   Basophils Absolute 0.0 0.0 - 0.1 K/uL   Immature Granulocytes 0 %   Abs Immature Granulocytes 0.04 0.00 - 0.07 K/uL    Comment: Performed at [facility], [address], [place], [place] [address]  Protime-INR     Status: None   Collection Time: 09/08/19 10:16 PM  Result Value Ref Range   Prothrombin Time 12.2 11.4 - 15.2 seconds   INR 0.9 0.8 - 1.2    Comment: (NOTE) INR goal varies based on device and disease states. Performed at [facility], [address] [place], [place] [address]   Blood gas, venous     Status: Abnormal   Collection Time: 09/08/2019 10:16 PM  Result Value Ref Range   pH, Ven 7.444 (H) 7.250 - 7.430   pCO2, Ven 36.4 (L) 44.0 - 60.0 mmHg   pO2, Ven LESS THAN REPORTABLE RANGE 32.0 - 45.0 mmHg    Comment: RESULT CALLED TO, READ BACK BY AND VERIFIED WITH: RN CELINE AT 2231 09/08/2019 CRUICKSHANK A    Bicarbonate  24.6 20.0 - 28.0 mmol/L   Acid-Base Excess 1.3 0.0 - 2.0 mmol/L   O2 Saturation 37.0 %   Patient temperature 98.6     Comment: Performed at [facility], [address] [place], [place] [address]  Ammonia     Status: None   Collection Time: 2019-09-08 10:16 PM  Result Value Ref Range   Ammonia 25 9 - 35 umol/L    Comment: Performed at [facility], [address] [place], [place] [address]  Respiratory Panel by RT PCR (Flu A&B, Covid) - Nasopharyngeal Swab     Status: None   Collection Time: 09-08-2019 10:16 PM   Specimen: Nasopharyngeal Swab  Result Value Ref Range   SARS Coronavirus 2 by RT PCR NEGATIVE NEGATIVE    Comment: (NOTE) SARS-CoV-2 target  nucleic acids are NOT DETECTED. The SARS-CoV-2 RNA is generally detectable in upper respiratoy specimens during the acute phase of infection. The lowest concentration of SARS-CoV-2 viral copies this assay can detect is 131 copies/mL. A negative result does not preclude SARS-Cov-2 infection and should not be used as the sole basis for treatment or other patient management decisions. A negative result may occur with  improper specimen collection/handling, submission of specimen other than nasopharyngeal swab, presence of viral mutation(s) within the areas targeted by this assay, and inadequate number of viral copies (<131 copies/mL). A negative result must be combined with clinical observations, patient history, and epidemiological information. The expected result is Negative. Fact Sheet for Patients:  https://www.moore.com/ Fact Sheet for Healthcare Providers:  https://www.young.biz/ This test is not yet ap proved or cleared by the [place] FDA and  has been authorized for detection and/or diagnosis of SARS-CoV-2 by FDA under an Emergency Use Authorization (EUA). This EUA will remain  in effect (meaning this test can be used) for the duration of  the COVID-19 declaration under Section 564(b)(1) of the Act, 21 U.S.C. section 360bbb-3(b)(1), unless the authorization is terminated or revoked sooner.    Influenza A by PCR NEGATIVE NEGATIVE   Influenza B by PCR NEGATIVE NEGATIVE    Comment: (NOTE) The Xpert Xpress SARS-CoV-2/FLU/RSV assay is intended as an aid in  the diagnosis of influenza from Nasopharyngeal swab specimens and  should not be used as a sole basis for treatment. Nasal washings and  aspirates are unacceptable for Xpert Xpress SARS-CoV-2/FLU/RSV  testing. Fact Sheet for Patients: https://www.moore.com/ Fact Sheet for Healthcare Providers: https://www.young.biz/ This test is not yet approved or cleared by the [place] FDA and  has been authorized for detection and/or diagnosis of SARS-CoV-2 by  FDA under an Emergency Use Authorization (EUA). This EUA will remain  in effect (meaning this test can be used) for the duration of the  Covid-19 declaration under Section 564(b)(1) of the Act, 21  U.S.C. section 360bbb-3(b)(1), unless the authorization is  terminated or revoked. Performed at [facility], [address], [place], [place] [address]   Vitamin B12     Status: None   Collection Time: 08/09/2019 10:16 PM  Result Value Ref Range   Vitamin B-12 201 180 - 914 pg/mL    Comment: (NOTE) This assay is not validated for testing neonatal or myeloproliferative syndrome specimens for Vitamin B12 levels. Performed at [facility], [address] [place], [place] [address]   Folate     Status: Abnormal   Collection Time: 08/08/2019 10:16 PM  Result Value Ref Range   Folate 2.5 (L) >5.9 ng/mL    Comment: Performed at [facility], [address] [place], [place] [address]  Troponin I (High Sensitivity)     Status: Abnormal   Collection Time: 08/11/19 12:05 AM  Result Value Ref Range   Troponin I (High Sensitivity) 21  (H) <18 ng/L    Comment: (NOTE) Elevated high sensitivity troponin I (hsTnI) values and significant  changes across serial measurements may suggest ACS but many other  chronic and acute conditions are known to elevate hsTnI results.  Refer to the "Links" section for chest pain algorithms and additional  guidance. Performed at [facility], [address] [place], [place] [address]   Basic metabolic panel     Status: Abnormal   Collection Time: 08/11/19 12:05 AM  Result Value Ref Range   Sodium 128 (L) 135 -  145 mmol/L   Potassium 3.1 (L) 3.5 - 5.1 mmol/L   Chloride 91 (L) 98 - 111 mmol/L   CO2 21 (L) 22 - 32 mmol/L   Glucose, Bld 114 (H) 70 - 99 mg/dL    Comment: Glucose reference range applies only to samples taken after fasting for at least 8 hours.   BUN 5 (L) 8 - 23 mg/dL   Creatinine, Ser 6.16 0.44 - 1.00 mg/dL   Calcium 8.5 (L) 8.9 - 10.3 mg/dL   GFR calc non Af Amer >60 >60 mL/min   GFR calc Af Amer >60 >60 mL/min   Anion gap 16 (H) 5 - 15    Comment: Performed at [facility], [address] [place], [place] [address]  Basic metabolic panel     Status: Abnormal   Collection Time: 08/11/19  5:16 AM  Result Value Ref Range   Sodium 129 (L) 135 - 145 mmol/L   Potassium 3.0 (L) 3.5 - 5.1 mmol/L   Chloride 94 (L) 98 - 111 mmol/L   CO2 21 (L) 22 - 32 mmol/L   Glucose, Bld 105 (H) 70 - 99 mg/dL    Comment: Glucose reference range applies only to samples taken after fasting for at least 8 hours.   BUN 5 (L) 8 - 23 mg/dL   Creatinine, Ser 0.62 0.44 - 1.00 mg/dL   Calcium 8.2 (L) 8.9 - 10.3 mg/dL   GFR calc non Af Amer >60 >60 mL/min   GFR calc Af Amer >60 >60 mL/min   Anion gap 14 5 - 15    Comment: Performed at [facility], [address] [place], [place] [address]    Medications:  Current Facility-Administered Medications  Medication Dose Route Frequency Provider Last Rate Last Admin  .  stroke: mapping our  early stages of recovery book   Does not apply Once Lewie Chamber, MD   Stopped at 08/11/19 0810  . acetaminophen (TYLENOL) tablet 650 mg  650 mg Oral Q4H PRN Lewie Chamber, MD       Or  . acetaminophen (TYLENOL) 160 MG/5ML solution 650 mg  650 mg Per Tube Q4H PRN Lewie Chamber, MD       Or  . acetaminophen (TYLENOL) suppository 650 mg  650 mg Rectal Q4H PRN Lewie Chamber, MD      . folic acid 1 mg in sodium chloride 0.9 % 50 mL IVPB  1 mg Intravenous Daily Lewie Chamber, MD 100.4 mL/hr at 08/11/19 0911 1 mg at 08/11/19 0911  . hydrALAZINE (APRESOLINE) tablet 10 mg  10 mg Oral Q6H Dorcas Carrow, MD   10 mg at 08/11/19 1340  . labetalol (NORMODYNE) injection 10 mg  10 mg Intravenous Q2H PRN Dorcas Carrow, MD      . levETIRAcetam (KEPPRA) tablet 500 mg  500 mg Oral BID Coletta Memos, MD      . multivitamin with minerals tablet 1 tablet  1 tablet Oral Daily Lewie Chamber, MD      . ondansetron [facility]) tablet 4 mg  4 mg Oral Q8H PRN Lewie Chamber, MD      . pantoprazole (PROTONIX) injection 40 mg  40 mg Intravenous QHS Lewie Chamber, MD      . potassium chloride SA (KLOR-CON) CR tablet 40 mEq  40 mEq Oral BID Dorcas Carrow, MD   40 mEq at 08/11/19 1202  . senna-docusate (Senokot-S) tablet 1 tablet  1 tablet Oral BID Lewie Chamber, MD      .  thiamine tablet 100 mg  100 mg Oral Daily Dwyane Dee, MD   100 mg at 08/11/19 1202   Or  . thiamine (B-1) injection 100 mg  100 mg Intravenous Daily Dwyane Dee, MD   100 mg at 09/01/2019 2250    Musculoskeletal: Strength & Muscle Tone: within normal limits Gait & Station: normal Patient leans: N/A  Psychiatric Specialty Exam: Physical Exam  Review of Systems  Blood pressure (!) 141/82, pulse 87, temperature 98.6 F (37 C), temperature source Oral, resp. rate 18, SpO2 98 %.There is no height or weight on file to calculate BMI.  General Appearance: Fairly Groomed  Eye Contact:  Fair  Speech:  Slow and delayed repsonses at times   Volume:  Normal  Mood:  Anxious  Affect:  Constricted and Depressed  Thought Process:  Coherent, Linear and Descriptions of Associations: Intact  Orientation:  Other:  Alert and orient x 3.   Thought Content:  Logical  Suicidal Thoughts:  No  Homicidal Thoughts:  No  Memory:  Immediate;   Fair Recent;   Poor Remote;   Poor  Judgement:  Poor  Insight:  Lacking  Psychomotor Activity:  Tremor  Concentration:  Concentration: Fair and Attention Span: Poor  Recall:  Poor  Fund of Knowledge:  Fair  Language:  Good  Akathisia:  No  Handed:  Right  AIMS (if indicated):     Assets:  Communication Skills Desire for Improvement Financial Resources/Insurance Leisure Time Physical Health Social Support Transportation  ADL's:  Intact  Cognition:  WNL  Sleep:        Treatment Plan Summary: Daily contact with patient to assess and evaluate symptoms and progress in treatment, Medication management and Plan Continue CIWA protocol. Will recommend inpatient once patient is medically cleared. At this time patient appears to be minimizing her alcohol use disorder and suicidal thoughts. Per her friend "Olivia Lister" she recently told him "that she bought a gun, and has to have alot of nerve to commit suicide. And I dont have the nerve yet. She has been talking about it for about 7-8 years now."  Will start prozac 20mg  po daily po depression.   Disposition: Recommend psychiatric Inpatient admission when medically cleared.  This service was provided via telemedicine using a 2-way, interactive audio and video technology.  Names of all persons participating in this telemedicine service and their role in this encounter. Name: Sheran Fava Role: PMHNP-BC  Name: Olivia Harris Role: Patient  Name: Olivia Harris Role: Gerome Sam, [place] 08/11/2019 2:32 PM

## 2019-08-11 NOTE — H&P (Addendum)
History and Physical    Olivia Harris WVP:[id number] DOB: 02-19-1951 DOA: 08/05/2019  PCP: Patient, No Pcp Per Patient coming from: home  I have personally briefly reviewed patient's old medical records in [facility]  Chief Complaint: "anxiety"  HPI: Olivia Harris is a 69 yo CF with PMH alcohol abuse (u/k amount or last drink), HTN, depression, anxiety, ADHD who presents to the ER with complaints of "anxiety".  History was partially obtained from the patient as well as chart review.  ED provider also spoke with the patient's significant other, [name], at home on the phone. Patient reportedly consumes multiple drinks of wine nightly.  She was unable to state when her last drink was and how much.  She does state that she has fallen recently but is unable to provide details regarding when or how she fell.  When asked if she was involved in a fights or any trauma she did quickly deny this.  Her affect is very odd during interview and she requires several seconds in order to think of answers.  She was however unable to fully partake in the neuro exam particularly assessing for paresthesias.  She seemed very confused by the questions.  On evaluation in the ER she underwent CT head which revealed "Crescentic, hyperdense subdural hemorrhage extending across the cerebral hemisphere most pronounced over the left frontal and temporal lobes, along the falx and left tentorium.  Mild mass effect with global sulcal effacement and left-to-right midline shift of 4 mm." Her case was discussed with neurology, Dr. Franky Macho and she was recommended for transfer to [facility]. No urgent invasive intervention was warranted.   Of note, patient was also placed under an IVC by ED provider.  The patient had endorsed wishes of trying to go home and per her friend, [name] she has a history of suicidal ideation and has recently bought a gun and been practicing how to use it.  Lab workup notable for: Na 126, K 3.3, Cl 86, AST 83,  ALT 173, TB 2.1 Ethanol <10, Lipase 27, Lactic 1.8, Trop 20, Ammonia 25 WBC 9.1, Hgb 15.1, HCT 41.1, PLTC 165 B12 201, Folate 2.5   Review of Systems: Patient unable to fully provide due to cognitive impairment  Past Medical History:  Diagnosis Date  . ADHD (attention deficit hyperactivity disorder) Dr.Steiner  . Anxiety Dr.Steiner  . Arthritis   . Decreased libido   . Depression Dr.Steiner  . Essential hypertension, benign  07/29/2012  . Onychomycosis   . Postmenopausal   . Scoliosis   . Scoliosis    degenerative   . Seborrheic dermatitis     Past Surgical History:  Procedure Laterality Date  . arthroscopic surgery shoulder  right, 1994(Dr.Rowan) bone spur removed     reports that she has quit smoking. Her smoking use included cigarettes. She smoked 0.20 packs per day. She has never used smokeless tobacco. She reports current alcohol use. She reports that she does not use drugs.  Allergies  Allergen Reactions  . Antihistamines, Chlorpheniramine-Type Other (See Comments)    hyperactivity    Family History  Problem Relation Age of Onset  . Thyroid disease Mother   . Arthritis Mother   . Heart disease Mother        CHF  . Hypertension Brother   . Arthritis Sister   . Arthritis Other        grandmother  . Hyperlipidemia Sister   . Heart disease Other        grandmother  .  Stroke Other        grandfather  . Hypertension Brother     Prior to Admission medications   Medication Sig Start Date End Date Taking? Authorizing Provider  lisinopril (PRINIVIL,ZESTRIL) 5 MG tablet take 1 tablet by mouth once daily Patient not taking: Reported on 08/29/2019 08/08/15   Terressa Koyanagi, DO    Physical Exam: Vitals:   08/24/2019 2230 08/20/2019 2315 08/31/2019 2346 08/11/19 0000  BP: (!) 156/96 (!) 143/83 (!) 148/97 (!) 149/96  Pulse: (!) 111 (!) 106 (!) 113 (!) 106  Resp: (!) 21  18   Temp:      TempSrc:      SpO2: 97% 97% 99% 95%   General appearance: Adult woman resting in  bed in no distress seemingly confused with slowed mentation and mild shaking in hands Head: Normocephalic, without obvious abnormality Eyes: PERRLA, EOMI Lungs: clear to auscultation bilaterally Heart: regular rate and rhythm and S1, S2 normal Abdomen: normal findings: bowel sounds normal and soft, non-tender Extremities: no edema Skin: mobility and turgor normal Neurologic: subtle tremor appreciated in hands; no asterixis; no focal weakness; unable to test sensory due to patient's confusion; able to move all 4 extremities to command with delayed reaction  Labs on Admission: I have personally reviewed following labs and imaging studies  CBC: Recent Labs  Lab 09/03/2019 2216  WBC 9.1  NEUTROABS 7.4  HGB 15.1*  HCT 41.1  MCV 94.7  PLT 165   Basic Metabolic Panel: Recent Labs  Lab 08/07/2019 2216  NA 126*  K 3.3*  CL 86*  CO2 22  GLUCOSE 134*  BUN 6*  CREATININE 0.59  CALCIUM 9.4   GFR: CrCl cannot be calculated (Unknown ideal weight.). Liver Function Tests: Recent Labs  Lab 08/18/2019 2216  AST 83*  ALT 173*  ALKPHOS 72  BILITOT 2.1*  PROT 7.1  ALBUMIN 4.4   Recent Labs  Lab 08/28/2019 2216  LIPASE 27   Recent Labs  Lab 08/13/2019 2216  AMMONIA 25   Coagulation Profile: Recent Labs  Lab 08/18/2019 2216  INR 0.9   Cardiac Enzymes: No results for input(s): CKTOTAL, CKMB, CKMBINDEX, TROPONINI in the last 168 hours. BNP (last 3 results) No results for input(s): PROBNP in the last 8760 hours. HbA1C: No results for input(s): HGBA1C in the last 72 hours. CBG: No results for input(s): GLUCAP in the last 168 hours. Lipid Profile: No results for input(s): CHOL, HDL, LDLCALC, TRIG, CHOLHDL, LDLDIRECT in the last 72 hours. Thyroid Function Tests: No results for input(s): TSH, T4TOTAL, FREET4, T3FREE, THYROIDAB in the last 72 hours. Anemia Panel: Recent Labs    08/17/2019 2216  VITAMINB12 201  FOLATE 2.5*   Urine analysis:    Component Value Date/Time    BILIRUBINUR neg 09/25/2010 1503   PROTEINUR neg 09/25/2010 1503   UROBILINOGEN neg 09/25/2010 1503   NITRITE neg 09/25/2010 1503   LEUKOCYTESUR neg 09/25/2010 1503    Radiological Exams on Admission: CT Head Wo Contrast  Result Date: 09/03/2019 CLINICAL DATA:  Confusion, altered mental status EXAM: CT HEAD WITHOUT CONTRAST TECHNIQUE: Contiguous axial images were obtained from the base of the skull through the vertex without intravenous contrast. COMPARISON:  None. FINDINGS: Brain: Crescentic, hyperdense subdural hemorrhage seen extending across the left frontal and parietal convexities with additional hyperdense hemorrhage seen layering along the left tentorium and along the left falx as well. There is fairly global sulcal effacement of the left cerebral hemisphere with some a minimal left-to-right midline shift approximately  4 mm best seen on coronal imaging 5/22. No other sites of hemorrhage. No CT evidence of large vascular territory infarct. Patchy areas of white matter hypoattenuation are most compatible with chronic microvascular angiopathy. Symmetric prominence of the ventricles, cisterns and sulci compatible with parenchymal volume loss. Vascular: No hyperdense vessel. Skull: No calvarial fracture or suspicious osseous lesion. No scalp swelling or hematoma. Sinuses/Orbits: Paranasal sinuses and mastoid air cells are predominantly clear. Pneumatization of the petrous apices. Included orbital structures are unremarkable. Other: None IMPRESSION: 1. Crescentic, hyperdense subdural hemorrhage extending across the cerebral hemisphere most pronounced over the left frontal and temporal lobes, along the falx and left tentorium. 2. Mild mass effect with global sulcal effacement and left-to-right midline shift of 4 mm. 3. No CT evidence of large vascular territory infarct. 4. Chronic microvascular angiopathy and parenchymal volume loss. Critical Value/emergent results were called by telephone at the time of  interpretation on 08/09/2019 at 9:55 pm to provider [facility] , who verbally acknowledged these results. Electronically Signed   By: Lovena Le M.D.   On: 08/05/2019 21:55    EKG: Independently reviewed. Sinus tach  Assessment/Plan  Subdural hematoma - left frontal/temporal lobes with mild mass effect w/ left to right midline shift 75mm; currently non-operative management - continue q4h neuro checks; transfer to [facility] for neuro eval and ongoing monitoring in case of intervention/expansion - control BP - likely serial CT head as per neuro; consult on transfer - SLP eval - continue seizure ppx per neuro  Acute toxic metabolic encephalopathy - likely multifactorial in setting of etoh use, SDH, hyponatremia - continue IVF, monitor SDH - CIWA - check B1 level; also at risk for deficiency   Involuntary commitment - patient was also placed under IVC via ER provider due to her attempting to want to go home as well as further history provided by "Barnabas Lister" at home that she's recently bought a gun and has been practicing how to use it; she has a history of suicidal ideations  Alcohol abuse - high risk for withdrawal with mild tremors noted in hands in ER - continue on CIWA on transfer - continue thiamine, folic, MVI   Hyponatremia - likely multifactorial as well; hypovolemia vs etoh use - Na 126 on admission; last known 132 in 2018 - start LR and trend BMP q4h  Hypokalemia - replete and recheck as needed  Elevated LFTs - possibly in setting of etoh use however ratio not typical - monitor for now, if uptrends then will initiate further workup  Folate deficiency B12 deficiency - continue supplements    DVT prophylaxis: SCD Code Status: Full code Family Communication: none Disposition Plan: home Consults called: neurology Admission status: Stepdown   Dwyane Dee, MD [facility] Pager: Secure chat via Am[employer] Or pager # : [phone]  If 7PM-7AM, please contact  night-coverage [email or web address] Use universal [place] password for that web site. If you do not have the password, please call the hospital operator.  08/11/2019, 1:01 AM

## 2019-08-11 NOTE — Consult Note (Signed)
Reason for Consult:acute subdural hematoma left panhemispheric Referring Physician: Prajna, Olivia Harris is an 69 y.o. female.  HPI: whom was brought to the [facility] ED yesterday at [place] with history of mental status change for 72 hours. This information is provided by the chart. Olivia Harris stated she was in the ED to obtain a refill on her anxiety medications. Reported to be confused and distracted. She was unable to identify the medication. Olivia Harris is a heavy alcohol user drinking 2-3 bottles of wine per day. She only eats pizza. The friend who provided this information felt that she was much slower to respond. Head CT in the ED revealed a panhemispheric acute subdural hematoma on the left side. The friend also felt she was close to catatonic. In the ED she was reported to be alert, moving all extremities, slow to respond. No cranial nerve deficits were noted. Olivia Harris has been involuntarily committed due to possible suicidal concerns. She is also felt to be at significant risk for ETOH withdrawal.  Past Medical History:  Diagnosis Date  . ADHD (attention deficit hyperactivity disorder) Dr.Steiner  . Anxiety Dr.Steiner  . Arthritis   . Decreased libido   . Depression Dr.Steiner  . Essential hypertension, benign  07/29/2012  . Onychomycosis   . Postmenopausal   . Scoliosis   . Scoliosis    degenerative   . Seborrheic dermatitis     Past Surgical History:  Procedure Laterality Date  . arthroscopic surgery shoulder  right, 1994(Dr.Rowan) bone spur removed    Family History  Problem Relation Age of Onset  . Thyroid disease Mother   . Arthritis Mother   . Heart disease Mother        CHF  . Hypertension Brother   . Arthritis Sister   . Arthritis Other        grandmother  . Hyperlipidemia Sister   . Heart disease Other        grandmother  . Stroke Other        grandfather  . Hypertension Brother     Social History:  reports that she has quit smoking. Her  smoking use included cigarettes. She smoked 0.20 packs per day. She has never used smokeless tobacco. She reports current alcohol use. She reports that she does not use drugs.  Allergies:  Allergies  Allergen Reactions  . Antihistamines, Chlorpheniramine-Type Other (See Comments)    hyperactivity    Medications: I have reviewed the patient's current medications.  Results for orders placed or performed during the hospital encounter of 08/18/2019 (from the past 48 hour(s))  Comprehensive metabolic panel     Status: Abnormal   Collection Time: 09/02/2019 10:16 PM  Result Value Ref Range   Sodium 126 (L) 135 - 145 mmol/L   Potassium 3.3 (L) 3.5 - 5.1 mmol/L   Chloride 86 (L) 98 - 111 mmol/L   CO2 22 22 - 32 mmol/L   Glucose, Bld 134 (H) 70 - 99 mg/dL    Comment: Glucose reference range applies only to samples taken after fasting for at least 8 hours.   BUN 6 (L) 8 - 23 mg/dL   Creatinine, Ser 0.59 0.44 - 1.00 mg/dL   Calcium 9.4 8.9 - 10.3 mg/dL   Total Protein 7.1 6.5 - 8.1 g/dL   Albumin 4.4 3.5 - 5.0 g/dL   AST 83 (H) 15 - 41 U/L   ALT 173 (H) 0 - 44 U/L   Alkaline Phosphatase 72 38 - 126 U/L  Total Bilirubin 2.1 (H) 0.3 - 1.2 mg/dL   GFR calc non Af Amer >60 >60 mL/min   GFR calc Af Amer >60 >60 mL/min   Anion gap 18 (H) 5 - 15    Comment: Performed at [facility], [address] [place], [place] [address]  Ethanol     Status: None   Collection Time: 11/29/19 10:16 PM  Result Value Ref Range   Alcohol, Ethyl (B) <10 <10 mg/dL    Comment: (NOTE) Lowest detectable limit for serum alcohol is 10 mg/dL. For medical purposes only. Performed at [facility], [address] [place], [place] [address]   Lipase, blood     Status: None   Collection Time: 11/29/19 10:16 PM  Result Value Ref Range   Lipase 27 11 - 51 U/L    Comment: Performed at [facility], [address] [place], [place] [address]  Lactic acid, plasma      Status: None   Collection Time: 11/29/19 10:16 PM  Result Value Ref Range   Lactic Acid, Venous 1.8 0.5 - 1.9 mmol/L    Comment: Performed at [facility], [address] [place], [place] [address]  Troponin I (High Sensitivity)     Status: Abnormal   Collection Time: 11/29/19 10:16 PM  Result Value Ref Range   Troponin I (High Sensitivity) 20 (H) <18 ng/L    Comment: (NOTE) Elevated high sensitivity troponin I (hsTnI) values and significant  changes across serial measurements may suggest ACS but many other  chronic and acute conditions are known to elevate hsTnI results.  Refer to the "Links" section for chest pain algorithms and additional  guidance. Performed at [facility], [address] [place], [place] [address]   CBC with Differential     Status: Abnormal   Collection Time: 11/29/19 10:16 PM  Result Value Ref Range   WBC 9.1 4.0 - 10.5 K/uL   RBC 4.34 3.87 - 5.11 MIL/uL   Hemoglobin 15.1 (H) 12.0 - 15.0 g/dL   HCT 13.041.1 86.536.0 - 78.446.0 %   MCV 94.7 80.0 - 100.0 fL   MCH 34.8 (H) 26.0 - 34.0 pg   MCHC 36.7 (H) 30.0 - 36.0 g/dL   RDW 69.612.8 29.511.5 - 28.415.5 %   Platelets 165 150 - 400 K/uL   nRBC 0.0 0.0 - 0.2 %   Neutrophils Relative % 82 %   Neutro Abs 7.4 1.7 - 7.7 K/uL   Lymphocytes Relative 7 %   Lymphs Abs 0.6 (L) 0.7 - 4.0 K/uL   Monocytes Relative 11 %   Monocytes Absolute 1.0 0.1 - 1.0 K/uL   Eosinophils Relative 0 %   Eosinophils Absolute 0.0 0.0 - 0.5 K/uL   Basophils Relative 0 %   Basophils Absolute 0.0 0.0 - 0.1 K/uL   Immature Granulocytes 0 %   Abs Immature Granulocytes 0.04 0.00 - 0.07 K/uL    Comment: Performed at [facility], [address] [place], [place] [address]  Protime-INR     Status: None   Collection Time: 11/29/19 10:16 PM  Result Value Ref Range   Prothrombin Time 12.2 11.4 - 15.2 seconds   INR 0.9 0.8 - 1.2    Comment: (NOTE) INR goal varies based on device and disease  states. Performed at [facility], [address] [place], [place] [address]   Blood gas, venous     Status: Abnormal   Collection Time:  08/15/2019 10:16 PM  Result Value Ref Range   pH, Ven 7.444 (H) 7.250 - 7.430   pCO2, Ven 36.4 (L) 44.0 - 60.0 mmHg   pO2, Ven LESS THAN REPORTABLE RANGE 32.0 - 45.0 mmHg    Comment: RESULT CALLED TO, READ BACK BY AND VERIFIED WITH: RN CELINE AT 2231 08-15-2019 CRUICKSHANK A    Bicarbonate 24.6 20.0 - 28.0 mmol/L   Acid-Base Excess 1.3 0.0 - 2.0 mmol/L   O2 Saturation 37.0 %   Patient temperature 98.6     Comment: Performed at [facility], [address], [place], [place] [address]  Ammonia     Status: None   Collection Time: 08-15-2019 10:16 PM  Result Value Ref Range   Ammonia 25 9 - 35 umol/L    Comment: Performed at [facility], [address] [place], [place] [address]  Respiratory Panel by RT PCR (Flu A&B, Covid) - Nasopharyngeal Swab     Status: None   Collection Time: 08/15/2019 10:16 PM   Specimen: Nasopharyngeal Swab  Result Value Ref Range   SARS Coronavirus 2 by RT PCR NEGATIVE NEGATIVE    Comment: (NOTE) SARS-CoV-2 target nucleic acids are NOT DETECTED. The SARS-CoV-2 RNA is generally detectable in upper respiratoy specimens during the acute phase of infection. The lowest concentration of SARS-CoV-2 viral copies this assay can detect is 131 copies/mL. A negative result does not preclude SARS-Cov-2 infection and should not be used as the sole basis for treatment or other patient management decisions. A negative result may occur with  improper specimen collection/handling, submission of specimen other than nasopharyngeal swab, presence of viral mutation(s) within the areas targeted by this assay, and inadequate number of viral copies (<131 copies/mL). A negative result must be combined with clinical observations, patient history, and epidemiological information. The expected  result is Negative. Fact Sheet for Patients:  https://www.moore.com/ Fact Sheet for Healthcare Providers:  https://www.young.biz/ This test is not yet ap proved or cleared by the [place] FDA and  has been authorized for detection and/or diagnosis of SARS-CoV-2 by FDA under an Emergency Use Authorization (EUA). This EUA will remain  in effect (meaning this test can be used) for the duration of the COVID-19 declaration under Section 564(b)(1) of the Act, 21 U.S.C. section 360bbb-3(b)(1), unless the authorization is terminated or revoked sooner.    Influenza A by PCR NEGATIVE NEGATIVE   Influenza B by PCR NEGATIVE NEGATIVE    Comment: (NOTE) The Xpert Xpress SARS-CoV-2/FLU/RSV assay is intended as an aid in  the diagnosis of influenza from Nasopharyngeal swab specimens and  should not be used as a sole basis for treatment. Nasal washings and  aspirates are unacceptable for Xpert Xpress SARS-CoV-2/FLU/RSV  testing. Fact Sheet for Patients: https://www.moore.com/ Fact Sheet for Healthcare Providers: https://www.young.biz/ This test is not yet approved or cleared by the [place] FDA and  has been authorized for detection and/or diagnosis of SARS-CoV-2 by  FDA under an Emergency Use Authorization (EUA). This EUA will remain  in effect (meaning this test can be used) for the duration of the  Covid-19 declaration under Section 564(b)(1) of the Act, 21  U.S.C. section 360bbb-3(b)(1), unless the authorization is  terminated or revoked. Performed at [facility], [address], [place], [place] [address]   Vitamin B12     Status: None   Collection Time: 08/15/2019 10:16 PM  Result Value Ref Range   Vitamin B-12 201 180 - 914 pg/mL  Comment: (NOTE) This assay is not validated for testing neonatal or myeloproliferative syndrome specimens for Vitamin B12 levels. Performed at [facility], [address], [place], [place] [address]   Folate     Status: Abnormal   Collection Time: 06-Sep-2019 10:16 PM  Result Value Ref Range   Folate 2.5 (L) >5.9 ng/mL    Comment: Performed at [facility], [address], [place], [place] [address]  Troponin I (High Sensitivity)     Status: Abnormal   Collection Time: 08/11/19 12:05 AM  Result Value Ref Range   Troponin I (High Sensitivity) 21 (H) <18 ng/L    Comment: (NOTE) Elevated high sensitivity troponin I (hsTnI) values and significant  changes across serial measurements may suggest ACS but many other  chronic and acute conditions are known to elevate hsTnI results.  Refer to the "Links" section for chest pain algorithms and additional  guidance. Performed at [facility], [address], [place], [place] [address]     CT Head Wo Contrast  Result Date: 09/06/19 CLINICAL DATA:  Confusion, altered mental status EXAM: CT HEAD WITHOUT CONTRAST TECHNIQUE: Contiguous axial images were obtained from the base of the skull through the vertex without intravenous contrast. COMPARISON:  None. FINDINGS: Brain: Crescentic, hyperdense subdural hemorrhage seen extending across the left frontal and parietal convexities with additional hyperdense hemorrhage seen layering along the left tentorium and along the left falx as well. There is fairly global sulcal effacement of the left cerebral hemisphere with some a minimal left-to-right midline shift approximately 4 mm best seen on coronal imaging 5/22. No other sites of hemorrhage. No CT evidence of large vascular territory infarct. Patchy areas of white matter hypoattenuation are most compatible with chronic microvascular angiopathy. Symmetric prominence of the ventricles, cisterns and sulci compatible with parenchymal volume loss. Vascular: No hyperdense vessel. Skull: No calvarial fracture or suspicious osseous lesion. No scalp swelling or  hematoma. Sinuses/Orbits: Paranasal sinuses and mastoid air cells are predominantly clear. Pneumatization of the petrous apices. Included orbital structures are unremarkable. Other: None IMPRESSION: 1. Crescentic, hyperdense subdural hemorrhage extending across the cerebral hemisphere most pronounced over the left frontal and temporal lobes, along the falx and left tentorium. 2. Mild mass effect with global sulcal effacement and left-to-right midline shift of 4 mm. 3. No CT evidence of large vascular territory infarct. 4. Chronic microvascular angiopathy and parenchymal volume loss. Critical Value/emergent results were called by telephone at the time of interpretation on 09-06-2019 at 9:55 pm to provider [facility] , who verbally acknowledged these results. Electronically Signed   By: Kreg Shropshire M.D.   On: Sep 06, 2019 21:55    Review of Systems  Constitutional: Negative.   Musculoskeletal: Negative.   Skin: Negative.    Blood pressure 140/88, pulse (!) 108, temperature 98.4 F (36.9 C), temperature source Oral, resp. rate 16, SpO2 98 %. Physical Exam  Constitutional: She appears well-developed. No distress.  HENT:  Head: Normocephalic.  Eyes: Pupils are equal, round, and reactive to light. Conjunctivae and EOM are normal.  Cardiovascular: Normal rate and regular rhythm.  Respiratory: Effort normal and breath sounds normal.  GI: Soft.  Musculoskeletal:     Cervical back: Normal range of motion.  Neurological: She is alert. GCS eye subscore is 4. GCS verbal subscore is 5. GCS motor subscore is 6. She displays no Babinski's sign on the right side. She displays no Babinski's sign on the left side.  Speech is nonfluent, clear. She stares more  than she interacts. Did not name place, situation, nor time. Able to identify herself, and state her address.  Unable to perform detailed sensory exam due to mental status. Gait is not assessed.    Assessment/Plan: Olivia Harris is a 69 y.o.  female Whom has an acute subdural causing minimal midline shift. She is alert, will follow commands, moving all extremities. No indication for acute evacuation of the hematoma. Should and is being transferred to [facility] in case neurosurgical intervention is required. Recommend repeat CT later today. Would provide seizure prophylaxis.   Coletta Memos 08/11/2019, 1:42 AM

## 2019-08-11 NOTE — ED Notes (Signed)
[facility] notified need for transport 

## 2019-08-11 NOTE — Evaluation (Signed)
Physical Therapy Evaluation Patient Details Name: Olivia Harris MRN: [id number] DOB: 1951/02/13 Today's Date: 08/11/2019   History of Present Illness  Pt is a 69 y/o female admitted secondary to subdural hematoma and suicidal ideation. PMH includes alcohol abuse, anxiety and scoliosis.   Clinical Impression  Pt admitted secondary to problem above with deficits below. Pt requiring mod A for short distance ambulation. Pt with poor sequencing and decreased safety awareness. Attempted to sit without any surface behind and required multimodal cues and physical assist to return to sitting on appropriate surface. Pt also with slowed processing. Pt currently lives alone, however, was independent with mobility. Feel pt will require post acute rehab at d/c, however, may progress well. Per psych, likely will require inpatient psych. Will continue to follow acutely to maximize functional mobility independence and safety.      Follow Up Recommendations SNF;Supervision/Assistance - 24 hour;Other (comment)(vs inpatient psych)    Equipment Recommendations  Other (comment)(TBD)    Recommendations for Other Services       Precautions / Restrictions Precautions Precautions: Fall;Other (comment) Precaution Comments: suicide precautions Restrictions Weight Bearing Restrictions: No      Mobility  Bed Mobility Overal bed mobility: Needs Assistance Bed Mobility: Supine to Sit;Sit to Supine     Supine to sit: Supervision Sit to supine: Supervision   General bed mobility comments: Supervision for safety. Increased time to respond to cues to sit at EOB.   Transfers Overall transfer level: Needs assistance Equipment used: 1 person hand held assist Transfers: Sit to/from [facility] Sit to Stand: Min assist Stand pivot transfers: Mod assist       General transfer comment: min A for steadying assist to stand. Pt requiring mod A for transfer to [facility]. Attempting to prematurely sit  and required multimodal cues and physical assist to finish transition to [facility]  Ambulation/Gait Ambulation/Gait assistance: Mod assist Gait Distance (Feet): 10 Feet Assistive device: 1 person hand held assist Gait Pattern/deviations: Step-through pattern;Decreased stride length;Trunk flexed Gait velocity: Decreased   General Gait Details: Very unsteady ambulation around the bed. Required mod A for steadying. Pt reaching for bed rails for support. Once at EOB, pt putting both hands on bed in flexed posture and attempted to sit. Required max cues to stand upright and required assist to pivot back to sitting at EOB.    Stairs            Wheelchair Mobility    Modified Rankin (Stroke Patients Only)       Balance Overall balance assessment: Needs assistance Sitting-balance support: No upper extremity supported;Feet supported Sitting balance-Leahy Scale: Fair     Standing balance support: Single extremity supported;During functional activity Standing balance-Leahy Scale: Poor Standing balance comment: Reliant on UE and external support                             Pertinent Vitals/Pain Pain Assessment: No/denies pain    Home Living Family/patient expects to be discharged to:: Private residence Living Arrangements: Alone Available Help at Discharge: Friend(s);Available PRN/intermittently Type of Home: House Home Access: Stairs to enter Entrance Stairs-Rails: Psychiatric nurse of Steps: flight Home Layout: One level Home Equipment: None      Prior Function Level of Independence: Independent               Hand Dominance   Dominant Hand: Right    Extremity/Trunk Assessment   Upper Extremity Assessment Upper Extremity Assessment: Defer to  OT evaluation    Lower Extremity Assessment Lower Extremity Assessment: Generalized weakness    Cervical / Trunk Assessment Cervical / Trunk Assessment: Other exceptions Cervical / Trunk  Exceptions: hx of scoliosis  Communication   Communication: No difficulties  Cognition Arousal/Alertness: Awake/alert Behavior During Therapy: WFL for tasks assessed/performed Overall Cognitive Status: No family/caregiver present to determine baseline cognitive functioning                                 General Comments: Alert and oriented X3. Did not know why she was in the hospital. Pt with slowed processing and difficulty sequencing.       General Comments General comments (skin integrity, edema, etc.): No family/caregiver present     Exercises     Assessment/Plan    PT Assessment Patient needs continued PT services  PT Problem List Decreased strength;Decreased balance;Decreased coordination;Decreased mobility;Decreased cognition;Decreased knowledge of use of DME;Decreased safety awareness;Decreased knowledge of precautions       PT Treatment Interventions Gait training;DME instruction;Stair training;Functional mobility training;Therapeutic activities;Therapeutic exercise;Balance training;Patient/family education;Cognitive remediation    PT Goals (Current goals can be found in the Care Plan section)  Acute Rehab PT Goals Patient Stated Goal: to go home PT Goal Formulation: With patient Time For Goal Achievement: 08/25/19 Potential to Achieve Goals: Good    Frequency Min 3X/week   Barriers to discharge Decreased caregiver support      Co-evaluation               AM-PAC PT "6 Clicks" Mobility  Outcome Measure Help needed turning from your back to your side while in a flat bed without using bedrails?: None Help needed moving from lying on your back to sitting on the side of a flat bed without using bedrails?: None Help needed moving to and from a bed to a chair (including a wheelchair)?: A Lot Help needed standing up from a chair using your arms (e.g., wheelchair or bedside chair)?: A Little Help needed to walk in hospital room?: A Lot Help needed  climbing 3-5 steps with a railing? : Total 6 Click Score: 16    End of Session Equipment Utilized During Treatment: Gait belt Activity Tolerance: Patient tolerated treatment well Patient left: in bed;with call bell/phone within reach;with family/visitor present Nurse Communication: Mobility status PT Visit Diagnosis: Unsteadiness on feet (R26.81);Muscle weakness (generalized) (M62.81)    Time: 1540-1600 PT Time Calculation (min) (ACUTE ONLY): 20 min   Charges:   PT Evaluation $PT Eval Moderate Complexity: 1 Mod          Farley Ly, PT, DPT  Acute Rehabilitation Services  Pager: [phone] Office: [phone]   Lehman Prom 08/11/2019, 5:31 PM

## 2019-08-11 NOTE — ED Notes (Signed)
Pt aware of need for urine  

## 2019-08-11 NOTE — Progress Notes (Signed)
Patient seen and examined.  She arrived to [facility] from [facility] emergency room.  Seen by multiple providers since early morning. Seen by nighttime hospitalist.  H&P reviewed.  Seen by neurosurgery. Patient is not very sure why she landed up in the hospital.  Denies any trauma. I told her about involuntary admission, she asked me if she can go home with her wishes.  I told her she will be seen by psychiatrist.    1.  Subdural hematoma: Currently with no active neurological deficit.  Unsure about mental status, however patient is fairly alert oriented and denies any complaints.  Prophylactic Keppra. Continue neurochecks.  Follow-up CT scan at 3 PM.  Followed by neurosurgery.  2.  Alcoholism/depression anxiety questionable suicidal attempt: Patient was not very clear to come up with suicidal ideation.  For me she denies any suicidal attempt or homicidal attempts.  She states she is just anxious. IVC done by ER physician. Consult psychiatry.  3.  Hyponatremia, treated with isotonic saline with improvement.  Discontinue further isotonic saline.  Will allow regular diet.  4.  Hypokalemia, replace aggressively.  Recheck and monitor levels.  We will check magnesium and phosphorus on this patient with alcoholism and chronic illnesses.  5.  Risk of alcohol withdrawal: On CIWA scale.  Avoiding benzodiazepines and sedation to do neurochecks per subdural hematoma.  Patient currently with no evidence of alcohol withdrawal.  On multivitamins.  Electrolytes been replaced.

## 2019-08-12 DIAGNOSIS — R45851 Suicidal ideations: Secondary | ICD-10-CM

## 2019-08-12 LAB — BASIC METABOLIC PANEL
Anion gap: 15 (ref 5–15)
BUN: 6 mg/dL — ABNORMAL LOW (ref 8–23)
CO2: 19 mmol/L — ABNORMAL LOW (ref 22–32)
Calcium: 8.5 mg/dL — ABNORMAL LOW (ref 8.9–10.3)
Chloride: 94 mmol/L — ABNORMAL LOW (ref 98–111)
Creatinine, Ser: 0.54 mg/dL (ref 0.44–1.00)
GFR calc Af Amer: >60 mL/min (ref >60)
GFR calc non Af Amer: >60 mL/min (ref >60)
Glucose, Bld: 81 mg/dL (ref 70–99)
Potassium: 4.6 mmol/L (ref 3.5–5.1)
Sodium: 128 mmol/L — ABNORMAL LOW (ref 135–145)

## 2019-08-12 NOTE — Progress Notes (Signed)
Patient ID: Olivia Harris, female   DOB: [birth year], 69 y.o.   MRN: [id number] Films reviewed no change in subdural. No herniation, basal cisterns are patent.

## 2019-08-12 NOTE — Evaluation (Signed)
Occupational Therapy Evaluation Patient Details Name: Olivia Harris MRN: [id number] DOB: 25-Jan-1951 Today's Date: 08/12/2019    History of Present Illness Pt is a 69 y/o female admitted secondary to subdural hematoma and suicidal ideation. PMH includes alcohol abuse, anxiety and scoliosis.    Clinical Impression   PT admitted with SDH s/p fall with suicide sitter in room. Pt currently with functional limitiations due to the deficits listed below (see OT problem list). Pt currently requires mod cues for sequence of adls task and min (A) to complete with accuracy. Pt very quick to terminate task. Pt with no recall of MD education on reason for admission or awareness to deficits. Pt will benefit from skilled OT to increase their independence and safety with adls and balance to allow discharge pending inpatient psych per chart review.     Follow Up Recommendations  Other (comment)(noted inpatient psych d/c in chart- limiting rehab rec)    Equipment Recommendations  None recommended by OT    Recommendations for Other Services Other (comment)(psych)     Precautions / Restrictions Precautions Precautions: Fall;Other (comment) Precaution Comments: suicide precautions      Mobility Bed Mobility Overal bed mobility: Needs Assistance Bed Mobility: Supine to Sit;Sit to Supine     Supine to sit: Supervision Sit to supine: Supervision      Transfers Overall transfer level: Needs assistance   Transfers: Sit to/from Stand Sit to Stand: Min guard         General transfer comment: reaching outside base of support for counter during transfer    Balance Overall balance assessment: Needs assistance         Standing balance support: Single extremity supported;During functional activity Standing balance-Leahy Scale: Poor Standing balance comment: reliant on UE support                           ADL either performed or assessed with clinical judgement   ADL  Overall ADL's : Needs assistance/impaired Eating/Feeding: Set up;Bed level   Grooming: Wash/dry face;Wash/dry hands;Oral care;Minimal assistance;Sitting Grooming Details (indicate cue type and reason): sitter and tech report poor participation with bath. OT giving backward chaining cues and patient completed bath at sink level. pt needs max cues and consistent cues to continue to task. Pt handed wash cloth each time she started to terminate task . pt continued task in this manner Upper Body Bathing: Moderate assistance;Sitting   Lower Body Bathing: Moderate assistance;Sit to/from stand Lower Body Bathing Details (indicate cue type and reason): pt attempting to walk away from sink without pulling up pants and underwear completely.  Upper Body Dressing : Minimal assistance;Sitting   Lower Body Dressing: Moderate assistance   Toilet Transfer: Min guard           Functional mobility during ADLs: Min guard       Vision Baseline Vision/History: Wears glasses Wears Glasses: Reading only       Perception     Praxis      Pertinent Vitals/Pain Pain Assessment: (feel terrible does not rate)     Hand Dominance Right   Extremity/Trunk Assessment Upper Extremity Assessment Upper Extremity Assessment: Overall WFL for tasks assessed   Lower Extremity Assessment Lower Extremity Assessment: Defer to PT evaluation   Cervical / Trunk Assessment Cervical / Trunk Assessment: Other exceptions Cervical / Trunk Exceptions: hx of scoliosis   Communication Communication Communication: No difficulties   Cognition Arousal/Alertness: Awake/alert Behavior During Therapy: Impulsive;Flat affect Overall Cognitive Status:  Impaired/Different from baseline Area of Impairment: Orientation;Attention;Memory;Awareness                 Orientation Level: Disoriented to;Place;Time;Situation Current Attention Level: Focused Memory: Decreased recall of precautions;Decreased short-term memory      Awareness: Intellectual   General Comments: pt reports "i just want to go home" pt with no recall of reason for admission or knowledge of SDH. pt brushing teeth two time sna dterminates task  pt requires verbal cues every 5 seconds to sustain attention to task to continue   General Comments  pt states "i feel terrible but unable to state where or what hurts. pt gives over all gernalized response. pt completed sink level task with cues throughout for sequence and completion.     Exercises     Shoulder Instructions      Home Living Family/patient expects to be discharged to:: Private residence Living Arrangements: Alone Available Help at Discharge: Friend(s);Available PRN/intermittently Type of Home: House Home Access: Stairs to enter [employer] of Steps: flight Entrance Stairs-Rails: Right;Left Home Layout: One level     Bathroom Shower/Tub: Walk-in shower;Tub/shower unit   Bathroom Toilet: Standard     Home Equipment: None   Additional Comments: no family present ( informatoin from PT evaluation)      Prior Functioning/Environment Level of Independence: Independent                 OT Problem List: Decreased strength;Decreased activity tolerance;Impaired balance (sitting and/or standing)      OT Treatment/Interventions: Self-care/ADL training;Therapeutic exercise;DME and/or AE instruction;Therapeutic activities;Cognitive remediation/compensation;Patient/family education;Balance training    OT Goals(Current goals can be found in the care plan section) Acute Rehab OT Goals Patient Stated Goal: to go home OT Goal Formulation: Patient unable to participate in goal setting Time For Goal Achievement: 08/26/19 Potential to Achieve Goals: Good  OT Frequency: Min 2X/week   Barriers to D/C:            Co-evaluation              AM-PAC OT "6 Clicks" Daily Activity     Outcome Measure Help from another person eating meals?: A Little Help from  another person taking care of personal grooming?: A Little Help from another person toileting, which includes using toliet, bedpan, or urinal?: A Little Help from another person bathing (including washing, rinsing, drying)?: A Little Help from another person to put on and taking off regular upper body clothing?: A Little Help from another person to put on and taking off regular lower body clothing?: A Little 6 Click Score: 18   End of Session Nurse Communication: Mobility status;Precautions  Activity Tolerance: Patient tolerated treatment well Patient left: in bed;with call bell/phone within reach;with bed alarm set;Other (comment)(sitter present)  OT Visit Diagnosis: Unsteadiness on feet (R26.81);Muscle weakness (generalized) (M62.81)                Time: 9485-4627 OT Time Calculation (min): 23 min Charges:  OT General Charges $OT Visit: 1 Visit OT Evaluation $OT Eval Moderate Complexity: 1 Mod   Brynn, OTR/L  Acute Rehabilitation Services Pager: [phone] Office: [phone] .   Mateo Flow 08/12/2019, 3:39 PM

## 2019-08-12 NOTE — Progress Notes (Signed)
[facility]  PROGRESS NOTE  Olivia Harris RCV:[id number] DOB: 10-19-1950 DOA: 08/16/2019 PCP: Patient, No Pcp Per   Brief HPI:    69 year old female with a history of alcohol abuse, hypertension, depression, anxiety, ADHD who came to ED with complaints of anxiety.  Patient was found to be confused in the ED.  She underwent CT head which showed crescentic hyperdense subdural hemorrhage extending across the cerebral hemispheres most pronounced over the left frontal and temporal lobes along the falx and left tentorium.  Mild mass-effect with global sulcal effacement and left right midline shift of 4 mm.  Neurosurgery saw the patient and recommended no invasive intervention.  Patient also was placed under IVC letter due to suicidal ideation.  As per significant other patient has history of suicidal ideation and has recently got older and has been practicing how to use it.   Subjective   Patient seen and examined, denies any complaints.   Assessment/Plan:     1. Subdural hematoma-patient seen by neurosurgery.  Continue Keppra.  No active neurological deficit.  No invasive intervention planned per neurosurgery. 2. Alcohol abuse/depression/suicidal attempt-patient was IVC by ER physician.  Patient's friend Barnabas Lister says that patient had not gone recently and was practicing week.  Psychiatry has been consulted.  Will await final psych recommendations.  Continue thiamine, folic acid. 3. Hyponatremia-sodium is 128, likely from alcohol abuse.  Will obtain  urine osmolality.  4. Transaminitis-LFTs elevated in setting of alcohol use.  Follow LFTs in a.m.     SpO2: 99 %   COVID-19 Labs  No results for input(s): DDIMER, FERRITIN, LDH, CRP in the last 72 hours.  Lab Results  Component Value Date   [place] NEGATIVE 08/15/2019     CBG: No results for input(s): GLUCAP in the last 168 hours.  CBC: Recent Labs  Lab 09/02/2019 2216  WBC 9.1  NEUTROABS 7.4  HGB 15.1*  HCT 41.1  MCV  94.7  PLT 102    Basic Metabolic Panel: Recent Labs  Lab 09/03/2019 2216 08/11/19 0005 08/11/19 0516 08/11/19 1627 08/12/19 0354  NA 126* 128* 129* 128* 128*  K 3.3* 3.1* 3.0* 3.5 4.6  CL 86* 91* 94* 93* 94*  CO2 22 21* 21* 21* 19*  GLUCOSE 134* 114* 105* 99 81  BUN 6* 5* 5* 7* 6*  CREATININE 0.59 0.52 0.47 0.61 0.54  CALCIUM 9.4 8.5* 8.2* 8.7* 8.5*  MG  --   --   --  1.7  --   PHOS  --   --   --  2.4*  --      Liver Function Tests: Recent Labs  Lab 08/09/2019 2216  AST 83*  ALT 173*  ALKPHOS 72  BILITOT 2.1*  PROT 7.1  ALBUMIN 4.4        DVT prophylaxis: SCDs  Code Status: Full code  Family Communication: No family at bedside  Disposition Plan:   Status is: Inpatient  Dispo: The patient is from: Home              Anticipated d/c is to: Home versus inpatient psych              Anticipated d/c date is:08/16/2019              Patient currently admitted for subdural hematoma, alcohol abuse, suicidal ideation.  Prior to discharge-patient will likely need inpatient psych.  Will obtain psychiatric consultation.        Scheduled medications:  .  stroke: mapping our early  stages of recovery book   Does not apply Once  . FLUoxetine  20 mg Oral QHS  . hydrALAZINE  10 mg Oral Q6H  . levETIRAcetam  500 mg Oral BID  . multivitamin with minerals  1 tablet Oral Daily  . pantoprazole (PROTONIX) IV  40 mg Intravenous QHS  . potassium chloride  40 mEq Oral BID  . senna-docusate  1 tablet Oral BID  . thiamine  100 mg Oral Daily   Or  . thiamine  100 mg Intravenous Daily    Consultants:  Neurosurgery  Procedures:    Antibiotics:   Anti-infectives (From admission, onward)   None       Objective   Vitals:   08/12/19 0600 08/12/19 0744 08/12/19 0901 08/12/19 1417  BP: 137/87 129/87 (!) 143/90 134/75  Pulse: 79 86 88 80  Resp: 14 18 17 16   Temp: 97.8 F (36.6 C) 98.2 F (36.8 C) 98.7 F (37.1 C) 98.2 F (36.8 C)  TempSrc: Oral Oral Oral Oral   SpO2: 97% 100% 100% 99%    Intake/Output Summary (Last 24 hours) at 08/12/2019 1510 Last data filed at 08/12/2019 0100 Gross per 24 hour  Intake 600 ml  Output -  Net 600 ml    05/06 1901 - 05/08 0700 In: 2025.6 [P.O.:840; I.V.:1185.6] Out: -   There were no vitals filed for this visit.  Physical Examination:    General-appears in no acute distress  Heart-S1-S2, regular, no murmur auscultated  Lungs-clear to auscultation bilaterally, no wheezing or crackles auscultated  Abdomen-soft, nontender, no organomegaly  Extremities-no edema in the lower extremities  Neuro-alert, oriented x3, no focal deficit noted    Data Reviewed:   Recent Results (from the past 240 hour(s))  Respiratory Panel by RT PCR (Flu A&B, Covid) - Nasopharyngeal Swab     Status: None   Collection Time: 08/25/2019 10:16 PM   Specimen: Nasopharyngeal Swab  Result Value Ref Range Status   SARS Coronavirus 2 by RT PCR NEGATIVE NEGATIVE Final    Comment: (NOTE) SARS-CoV-2 target nucleic acids are NOT DETECTED. The SARS-CoV-2 RNA is generally detectable in upper respiratoy specimens during the acute phase of infection. The lowest concentration of SARS-CoV-2 viral copies this assay can detect is 131 copies/mL. A negative result does not preclude SARS-Cov-2 infection and should not be used as the sole basis for treatment or other patient management decisions. A negative result may occur with  improper specimen collection/handling, submission of specimen other than nasopharyngeal swab, presence of viral mutation(s) within the areas targeted by this assay, and inadequate number of viral copies (<131 copies/mL). A negative result must be combined with clinical observations, patient history, and epidemiological information. The expected result is Negative. Fact Sheet for Patients:  10/10/19 Fact Sheet for Healthcare Providers:   https://www.moore.com/ This test is not yet ap proved or cleared by the https://www.young.biz/ FDA and  has been authorized for detection and/or diagnosis of SARS-CoV-2 by FDA under an Emergency Use Authorization (EUA). This EUA will remain  in effect (meaning this test can be used) for the duration of the COVID-19 declaration under Section 564(b)(1) of the Act, 21 U.S.C. section 360bbb-3(b)(1), unless the authorization is terminated or revoked sooner.    Influenza A by PCR NEGATIVE NEGATIVE Final   Influenza B by PCR NEGATIVE NEGATIVE Final    Comment: (NOTE) The Xpert Xpress SARS-CoV-2/FLU/RSV assay is intended as an aid in  the diagnosis of influenza from Nasopharyngeal swab specimens and  should not be  used as a sole basis for treatment. Nasal washings and  aspirates are unacceptable for Xpert Xpress SARS-CoV-2/FLU/RSV  testing. Fact Sheet for Patients: https://www.moore.com/ Fact Sheet for Healthcare Providers: https://www.young.biz/ This test is not yet approved or cleared by the [place] FDA and  has been authorized for detection and/or diagnosis of SARS-CoV-2 by  FDA under an Emergency Use Authorization (EUA). This EUA will remain  in effect (meaning this test can be used) for the duration of the  Covid-19 declaration under Section 564(b)(1) of the Act, 21  U.S.C. section 360bbb-3(b)(1), unless the authorization is  terminated or revoked. Performed at [facility], [address], [place], [place] [address]     Recent Labs  Lab 23-Aug-2019 2216  LIPASE 27   Recent Labs  Lab Aug 23, 2019 2216  AMMONIA 25     Studies:  CT HEAD WO CONTRAST  Result Date: 08/11/2019 CLINICAL DATA:  Stroke follow-up EXAM: CT HEAD WITHOUT CONTRAST TECHNIQUE: Contiguous axial images were obtained from the base of the skull through the vertex without intravenous contrast. COMPARISON:  Yesterday FINDINGS: Brain:  High-density subdural hematoma along the left cerebral convexity measuring up to 10 mm in thickness. There is cortical mass effect and sulcal effacement-midline shift is unchanged at 4 mm. Negative for infarct or hydrocephalus. Mild white matter disease and volume loss. Vascular: Atherosclerotic calcification Skull: Normal. Negative for fracture or focal lesion. Sinuses/Orbits: No acute finding. IMPRESSION: Unchanged subdural hematoma on the left measuring up to 1 cm in thickness. Midline shift is 4 mm. Electronically Signed   By: Marnee Spring M.D.   On: 08/11/2019 16:01   CT Head Wo Contrast  Result Date: 08-23-2019 CLINICAL DATA:  Confusion, altered mental status EXAM: CT HEAD WITHOUT CONTRAST TECHNIQUE: Contiguous axial images were obtained from the base of the skull through the vertex without intravenous contrast. COMPARISON:  None. FINDINGS: Brain: Crescentic, hyperdense subdural hemorrhage seen extending across the left frontal and parietal convexities with additional hyperdense hemorrhage seen layering along the left tentorium and along the left falx as well. There is fairly global sulcal effacement of the left cerebral hemisphere with some a minimal left-to-right midline shift approximately 4 mm best seen on coronal imaging 5/22. No other sites of hemorrhage. No CT evidence of large vascular territory infarct. Patchy areas of white matter hypoattenuation are most compatible with chronic microvascular angiopathy. Symmetric prominence of the ventricles, cisterns and sulci compatible with parenchymal volume loss. Vascular: No hyperdense vessel. Skull: No calvarial fracture or suspicious osseous lesion. No scalp swelling or hematoma. Sinuses/Orbits: Paranasal sinuses and mastoid air cells are predominantly clear. Pneumatization of the petrous apices. Included orbital structures are unremarkable. Other: None IMPRESSION: 1. Crescentic, hyperdense subdural hemorrhage extending across the cerebral hemisphere  most pronounced over the left frontal and temporal lobes, along the falx and left tentorium. 2. Mild mass effect with global sulcal effacement and left-to-right midline shift of 4 mm. 3. No CT evidence of large vascular territory infarct. 4. Chronic microvascular angiopathy and parenchymal volume loss. Critical Value/emergent results were called by telephone at the time of interpretation on 08-23-2019 at 9:55 pm to provider [facility] , who verbally acknowledged these results. Electronically Signed   By: Kreg Shropshire M.D.   On: Aug 23, 2019 21:55       Gagan S Lama   [facility] If 7PM-7AM, please contact night-coverage at [email or web address], Office  [phone]   08/12/2019, 3:10 PM  LOS: 2 days

## 2019-08-12 NOTE — Progress Notes (Addendum)
Patient ID: Olivia Harris, female   DOB: 03/02/1951, 69 y.o.   MRN: [id number] Vital signs are stable Patient is awake and arousable and is speaking well but slow She does not wish to be in the hospital She is moving her extremities appropriately and symmetrically There is no facial weakness  Explained to her the rationale for her being there because of the subdural hematoma She seems to understand Continue to observe clinically

## 2019-08-12 NOTE — Progress Notes (Signed)
Patient refusing to wear cardiac monitor.

## 2019-08-13 DIAGNOSIS — S065X9A Traumatic subdural hemorrhage with loss of consciousness of unspecified duration, initial encounter: Secondary | ICD-10-CM

## 2019-08-13 DIAGNOSIS — F321 Major depressive disorder, single episode, moderate: Secondary | ICD-10-CM

## 2019-08-13 LAB — COMPREHENSIVE METABOLIC PANEL
ALT: 76 U/L — ABNORMAL HIGH (ref 0–44)
AST: 36 U/L (ref 15–41)
Albumin: 3.1 g/dL — ABNORMAL LOW (ref 3.5–5.0)
Alkaline Phosphatase: 57 U/L (ref 38–126)
Anion gap: 12 (ref 5–15)
BUN: 5 mg/dL — ABNORMAL LOW (ref 8–23)
CO2: 21 mmol/L — ABNORMAL LOW (ref 22–32)
Calcium: 8.8 mg/dL — ABNORMAL LOW (ref 8.9–10.3)
Chloride: 96 mmol/L — ABNORMAL LOW (ref 98–111)
Creatinine, Ser: 0.55 mg/dL (ref 0.44–1.00)
GFR calc Af Amer: >60 mL/min (ref >60)
GFR calc non Af Amer: >60 mL/min (ref >60)
Glucose, Bld: 87 mg/dL (ref 70–99)
Potassium: 4 mmol/L (ref 3.5–5.1)
Sodium: 129 mmol/L — ABNORMAL LOW (ref 135–145)
Total Bilirubin: 1.5 mg/dL — ABNORMAL HIGH (ref 0.3–1.2)
Total Protein: 5.7 g/dL — ABNORMAL LOW (ref 6.5–8.1)

## 2019-08-13 LAB — TSH: TSH: 1.62 u[IU]/mL (ref 0.350–4.500)

## 2019-08-13 MED ORDER — FOLIC ACID 1 MG PO TABS
1.0000 mg | ORAL_TABLET | Freq: Every day | ORAL | Status: DC
  Administered 2019-08-14 – 2019-08-17 (×4): 1 mg via ORAL
  Filled 2019-08-13 (×4): qty 1

## 2019-08-13 MED ORDER — FOLIC ACID 1 MG PO TABS: 1.0000 mg | ORAL_TABLET | Freq: Every day | ORAL | Status: DC

## 2019-08-13 MED ORDER — PANTOPRAZOLE SODIUM 40 MG PO TBEC
40.0000 mg | DELAYED_RELEASE_TABLET | Freq: Every day | ORAL | Status: DC
  Administered 2019-08-13 – 2019-08-16 (×4): 40 mg via ORAL
  Filled 2019-08-13 (×4): qty 1

## 2019-08-13 NOTE — Progress Notes (Signed)
Occupational Therapy Treatment Patient Details Name: Olivia Harris MRN: [id number] DOB: 09-03-1950 Today's Date: 08/13/2019    History of present illness Pt is a 69 y/o female admitted secondary to subdural hematoma and suicidal ideation. PMH includes alcohol abuse, anxiety and scoliosis.    OT comments  Pt progressing with OOB ADL with minguardA at sink and trail making task in hallway with minA overall for stability with HHA on L side. Pt decreased awareness of safety and deficits with decreased ability to follow multistep commands throughout trail making task in hallway. Pt easily distracted and running into items on L side with RW. Pt requires encouragement for OOB mobility and ADL, but able to perform easily once OOB. OT to continue to assess balance, cognition and self care in order to d/c to IP psych. OT following acutely.    Follow Up Recommendations  Other (comment)(IP psych)    Equipment Recommendations  None recommended by OT    Recommendations for Other Services Other (comment)(psych)    Precautions / Restrictions Precautions Precautions: Fall;Other (comment) Precaution Comments: suicide precautions Restrictions Weight Bearing Restrictions: No       Mobility Bed Mobility Overal bed mobility: Needs Assistance Bed Mobility: Supine to Sit     Supine to sit: Supervision     General bed mobility comments: Left sitting EOB with NT to call for meal  Transfers Overall transfer level: Needs assistance   Transfers: Sit to/from Stand Sit to Stand: Min guard              Balance Overall balance assessment: Needs assistance   Sitting balance-Leahy Scale: Fair     Standing balance support: Single extremity supported;Bilateral upper extremity supported Standing balance-Leahy Scale: Poor Standing balance comment: reliant on LUE HHA for mobility and sink for grooming tasks                           ADL either performed or assessed with clinical  judgement   ADL Overall ADL's : Needs assistance/impaired     Grooming: Wash/dry hands;Wash/dry face;Oral care;Brushing hair;Min guard;Standing Grooming Details (indicate cue type and reason): Cues to comb back of head; pt motivated to perform                             Functional mobility during ADLs: Minimal assistance;Rolling walker;Cueing for safety(RW and HHA for task, bumping into items on L side with RW) General ADL Comments: Pt requires moderate encouragement to continue to perform OOB ADL. Pt requiring moderate cueing to avoid obstacles on L side; pt decreased awareness of deficits.     Vision   Additional Comments: continue to assess. pt kept bumping into items on L   Perception     Praxis      Cognition Arousal/Alertness: Awake/alert Behavior During Therapy: Flat affect Overall Cognitive Status: Impaired/Different from baseline Area of Impairment: Orientation;Attention;Memory;Awareness;Following commands                 Orientation Level: Disoriented to;Place;Time;Situation Current Attention Level: Focused Memory: Decreased short-term memory Following Commands: Follows multi-step commands inconsistently   Awareness: Intellectual   General Comments: Pt decreased awareness of safety and deficits with decreased ability to follow multistep commands throughout trail making task in hallway. Pt easily distracted and running into items on L side.          Exercises     Shoulder Instructions  General Comments VSS on RA. Pt looking at IV pole in distress, but pt reassured that we could take it with [place]    Pertinent Vitals/ Pain       Pain Assessment: No/denies pain  Home Living                                          Prior Functioning/Environment              Frequency  Min 2X/week        Progress Toward Goals  OT Goals(current goals can now be found in the care plan section)  Progress towards OT goals:  Progressing toward goals  Acute Rehab OT Goals Patient Stated Goal: to go home OT Goal Formulation: Patient unable to participate in goal setting Time For Goal Achievement: 08/26/19 Potential to Achieve Goals: Good ADL Goals Pt Will Perform Grooming: with modified independence;sitting Pt Will Perform Upper Body Bathing: with modified independence;sitting Pt Will Perform Lower Body Bathing: with modified independence;sit to/from stand Pt Will Perform Upper Body Dressing: with modified independence;sitting Pt Will Perform Lower Body Dressing: with modified independence;sit to/from stand Pt Will Transfer to Toilet: with supervision;ambulating Additional ADL Goal #1: pt will complete 5 step pathfinding task supervision level  Plan Discharge plan remains appropriate    Co-evaluation                 AM-PAC OT "6 Clicks" Daily Activity     Outcome Measure   Help from another person eating meals?: A Little Help from another person taking care of personal grooming?: A Little Help from another person toileting, which includes using toliet, bedpan, or urinal?: A Little Help from another person bathing (including washing, rinsing, drying)?: A Little Help from another person to put on and taking off regular upper body clothing?: A Little Help from another person to put on and taking off regular lower body clothing?: A Little 6 Click Score: 18    End of Session Equipment Utilized During Treatment: Gait belt;Rolling walker  OT Visit Diagnosis: Unsteadiness on feet (R26.81);Muscle weakness (generalized) (M62.81)   Activity Tolerance Patient tolerated treatment well   Patient Left in bed;with call bell/phone within reach;with nursing/sitter in room   Nurse Communication Mobility status;Precautions        Time: 1478-2956 OT Time Calculation (min): 18 min  Charges: OT General Charges $OT Visit: 1 Visit OT Treatments $Cognitive Funtion inital: Initial 15 mins  Jefferey Pica,  OTR/L Acute Rehabilitation Services Pager: [phone] Office: [phone]    Thuan Tippett C 08/13/2019, 3:52 PM

## 2019-08-13 NOTE — Progress Notes (Signed)
Pt refusing to wear telemetry monitor.

## 2019-08-13 NOTE — Consult Note (Addendum)
[facility] Face-Face Consultation   Reason for Consult:  Suicidal ideations Referring Physician:  Dr. Sharl Ma Location of Patient: [phone] Location of Provider: [facility]  Patient Identification: Olivia Harris MRN:  [id number] Principal Diagnosis: Subdural hematoma [facility]) Diagnosis:  Principal Problem:   Subdural hematoma (HCC) Active Problems:   Major depressive disorder, single episode, moderate degree (HCC)   Alcohol abuse   Hyponatremia  Total Time spent with patient: 1 hour  Subjective:   My depression is "fine."  "I'm not depressed."  Client seen and evaluated in person by this provider.  Initially assessed in the ED for suicidal ideations and depression.  Reported she takes Xanax, nothing on the PDMP except Adderall in 2019.  Today, she denies suicidal/homicidal ideations, hallucinations, and alcohol use--"not lately".  Her UDS was negative on admission.  She appears depressed and slow to respond.  Does take Prozac 20 mg daily for depression.  Her sleep and appetite are "ok."  Denies hallucinations.  Lives alone and does own a gun, "I bought it a long time ago."  She identifies her support system as her boyfriend, Olivia Harris, who she gave permission to speak with.  He was called by this provider at 1405 and stated he brought her to the ED as she was "unsteady and a little vague in some areas, not 100% herself."  Mentally she seemed "ok" to him.  When asked about suicidal ideations or statements, he started she has mentioned suicidal ideation over the past ten years that he has known her but "I thought she was trying to get attention.  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  Caveat:  Continues to be hyponatremic, hypocholerimic, folate low at 2.5  Dr Jola Babinski, psychiatrist, reviewed this client and recommends discontinuing the Prozac as it can potentiate  her  hyponatremia.  Consult on 08/11/19 by Caryn Bee: Clemens Catholic is a 69 y.o. female patient admitted with alcohol abuse, hypertension, depression, anxiety and suicidal thoughts. Patient originally reported this upon entering the ER, however on this day denies the above. She is calm and cooperative, although she does not appear to be forthcoming of her condition. Patient is unable to recall the reason for her presenting to the hospital. Provider offered patient her reason for admission and she said it was for her anxiety. " I came to get my medicine Alprazolam. I been taking it for a couple years. " Patient is unable to recall who prescribed that medications for her. She remains vague throughout the assessment and most of her answers are "I dont know, I cant/dont remember, a little bit. " When assessing psychiatric history she denies previous diagnoses, inpatient treatment, outpatient treatment, psych meds, or attempts. Writer then inquired about alprazolam use, and patient reports " I dont know". She again fails to provide information for outpatient provider or PCP, the dosage of alprazolam, frequency and duration. She originally denies substance abuse, then repeorts she drinks about 1-2 glasses of wine for the past couple years. She denies any legal history, probation. She denies any family history of mental illness.   HPI:  Ms. [name] is a 69 yo CF with PMH alcohol abuse (u/k amount or last drink), HTN, depression, anxiety, ADHD who presents to the ER with complaints of "anxiety".  History was partially obtained from the patient as well as chart review.  ED provider also spoke with the patient's  significant other, Olivia Harris, at home on the phone.  Patient reportedly consumes multiple drinks of wine nightly.  She was unable to state when her last drink was and how much.  She does state that she has fallen recently but is unable to provide details regarding when or how she fell.  When asked if she  was involved in a fights or any trauma she did quickly deny this.  Her affect is very odd during interview and she requires several seconds in order to think of answers.  She was however unable to fully partake in the neuro exam particularly assessing for paresthesias.  She seemed very confused by the questions.  As per Olivia Harris he and the patient have been friends for many years. Patient could be heard throughout recanting some of her statement and becoming confrontational with Olivia Harris regarding her alcohol use and suicidality. " Her physical health has been on the decline has been more apparent here recently due to her alcohol use. Olivia Harris later confirmed matter fact that patient buys wine by the cases and drinks about 2-3 cases per week. He is unaware how many bottles come in a case. He later narrows it down to about 5-6 bottles per day. He states for the past 7-8 years patient has been talking to him about suicide. " She once said that you have to have a lot of nerve to commit suicide and I dont have the nerve yet. I dont know if she has found the nerve to do it yet. But I am concerned. She recently bought a gun. " Patient did not detest or deny the last statement. Olivia Harris said " I guess Im in trouble now. I may not be welcomed here anymore. "   During the evaluation patient is alert and oriented x3. She originally told me that her date of birth was 05/24/1954, upon her stating this her nurse who remained at the bedside corrected her and provided her right date of birth. Patient was able to identify todays date. She endorsed anxiety, and her affect was congruent, constricted. She was not forthcoming with much information, very vague and slow to respond with most questions. Patient reported an active prescription for alprazolam which could not be verified by PMDP. Patient stated it was an active prescription and prescribed in the state of [place]. During the evaluation patients friend Olivia Harris entered the room and consent was  obtained to talk with him. She lives alone and works part time as a Psychologist, sport and exercise. SHe has one child who lives in [place]. She denies suicidal ideations at this time. At this time patient with history of depression, anxiety, and alcohol abuse, and unable to confirm how much she drinks. She has difficulty recalling her reason for coming to the emergency room yet states that she has chronic suicidal thoughts. As per her friend Olivia Harris she recently bought a gun and her physical health has been on a decline. Due to risk factors at this time will recommend inpatient. Patient also remains a risk to her self and continues to meet inpatient criteria. Prior to discharge will need to have home secured and gun removed from the home.   Past Psychiatric History: depression, anxiety, ADHD  Risk to Self:  Yes Risk to Others:  No Prior Inpatient Therapy:   No Prior Outpatient Therapy:  Yes  Past Medical History:  Past Medical History:  Diagnosis Date  . ADHD (attention deficit hyperactivity disorder) Dr.Steiner  . Anxiety Dr.Steiner  . Arthritis   . Decreased  libido   . Depression Dr.Steiner  . Essential hypertension, benign  07/29/2012  . Onychomycosis   . Postmenopausal   . Scoliosis   . Scoliosis    degenerative   . Seborrheic dermatitis     Past Surgical History:  Procedure Laterality Date  . arthroscopic surgery shoulder  right, 1994(Dr.Rowan) bone spur removed   Family History:  Family History  Problem Relation Age of Onset  . Thyroid disease Mother   . Arthritis Mother   . Heart disease Mother        CHF  . Hypertension Brother   . Arthritis Sister   . Arthritis Other        grandmother  . Hyperlipidemia Sister   . Heart disease Other        grandmother  . Stroke Other        grandfather  . Hypertension Brother    Family Psychiatric  History:Denies Social History:  Social History   Substance and Sexual Activity  Alcohol Use Yes     Social History   Substance and Sexual  Activity  Drug Use No    Social History   Socioeconomic History  . Marital status: Single    Spouse name: Not on file  . Number of children: Not on file  . Years of education: Not on file  . Highest education level: Not on file  Occupational History  . Not on file  Tobacco Use  . Smoking status: Former Smoker    Packs/day: 0.20    Types: Cigarettes  . Smokeless tobacco: Never Used  Substance and Sexual Activity  . Alcohol use: Yes  . Drug use: No  . Sexual activity: Not on file  Other Topics Concern  . Not on file  Social History Narrative  . Not on file   Social Determinants of Health   Financial Resource Strain:   . Difficulty of Paying Living Expenses:   Food Insecurity:   . Worried About Programme researcher, broadcasting/film/video in the Last Year:   . Barista in the Last Year:   Transportation Needs:   . Freight forwarder (Medical):   [name] Lack of Transportation (Non-Medical):   Physical Activity:   . Days of Exercise per Week:   . Minutes of Exercise per Session:   Stress:   . Feeling of Stress :   Social Connections:   . Frequency of Communication with Friends and Family:   . Frequency of Social Gatherings with Friends and Family:   . Attends Religious Services:   . Active Member of Clubs or Organizations:   . Attends Banker Meetings:   [name] Marital Status:    Additional Social History:    Allergies:   Allergies  Allergen Reactions  . Antihistamines, Chlorpheniramine-Type Other (See Comments)    hyperactivity    Labs:  Results for orders placed or performed during the hospital encounter of 08/08/2019 (from the past 48 hour(s))  Basic metabolic panel     Status: Abnormal   Collection Time: 08/11/19  4:27 PM  Result Value Ref Range   Sodium 128 (L) 135 - 145 mmol/L   Potassium 3.5 3.5 - 5.1 mmol/L   Chloride 93 (L) 98 - 111 mmol/L   CO2 21 (L) 22 - 32 mmol/L   Glucose, Bld 99 70 - 99 mg/dL    Comment: Glucose reference range applies only to  samples taken after fasting for at least 8 hours.   BUN 7 (L) 8 -  23 mg/dL   Creatinine, Ser 1.65 0.44 - 1.00 mg/dL   Calcium 8.7 (L) 8.9 - 10.3 mg/dL   GFR calc non Af Amer >60 >60 mL/min   GFR calc Af Amer >60 >60 mL/min   Anion gap 14 5 - 15    Comment: Performed at [facility], [address] [place], [place] [address]  Magnesium     Status: None   Collection Time: 08/11/19  4:27 PM  Result Value Ref Range   Magnesium 1.7 1.7 - 2.4 mg/dL    Comment: Performed at [facility], [address], [place], [place] [address]  Phosphorus     Status: Abnormal   Collection Time: 08/11/19  4:27 PM  Result Value Ref Range   Phosphorus 2.4 (L) 2.5 - 4.6 mg/dL    Comment: Performed at [facility], [address], [place], [place] [address]  Basic metabolic panel     Status: Abnormal   Collection Time: 08/12/19  3:54 AM  Result Value Ref Range   Sodium 128 (L) 135 - 145 mmol/L   Potassium 4.6 3.5 - 5.1 mmol/L    Comment: SPECIMEN HEMOLYZED. HEMOLYSIS MAY AFFECT INTEGRITY OF RESULTS.   Chloride 94 (L) 98 - 111 mmol/L   CO2 19 (L) 22 - 32 mmol/L   Glucose, Bld 81 70 - 99 mg/dL    Comment: Glucose reference range applies only to samples taken after fasting for at least 8 hours.   BUN 6 (L) 8 - 23 mg/dL   Creatinine, Ser 9.20 0.44 - 1.00 mg/dL   Calcium 8.5 (L) 8.9 - 10.3 mg/dL   GFR calc non Af Amer >60 >60 mL/min   GFR calc Af Amer >60 >60 mL/min   Anion gap 15 5 - 15    Comment: Performed at [facility], [address], [place], [place] [address]  Comprehensive metabolic panel     Status: Abnormal   Collection Time: 08/13/19  4:56 AM  Result Value Ref Range   Sodium 129 (L) 135 - 145 mmol/L   Potassium 4.0 3.5 - 5.1 mmol/L   Chloride 96 (L) 98 - 111 mmol/L   CO2 21 (L) 22 - 32 mmol/L   Glucose, Bld 87 70 - 99 mg/dL    Comment: Glucose reference range applies only to samples taken after fasting for at least 8 hours.   BUN 5 (L) 8 - 23 mg/dL   Creatinine,  Ser 2.19 0.44 - 1.00 mg/dL   Calcium 8.8 (L) 8.9 - 10.3 mg/dL   Total Protein 5.7 (L) 6.5 - 8.1 g/dL   Albumin 3.1 (L) 3.5 - 5.0 g/dL   AST 36 15 - 41 U/L   ALT 76 (H) 0 - 44 U/L   Alkaline Phosphatase 57 38 - 126 U/L   Total Bilirubin 1.5 (H) 0.3 - 1.2 mg/dL   GFR calc non Af Amer >60 >60 mL/min   GFR calc Af Amer >60 >60 mL/min   Anion gap 12 5 - 15    Comment: Performed at [facility], [address], [place], [place] [address]    Medications:  Current Facility-Administered Medications  Medication Dose Route Frequency Provider Last Rate Last Admin  .  stroke: mapping our early stages of recovery book   Does not apply Once Lewie Chamber, MD   Stopped at 08/11/19 0810  . acetaminophen (TYLENOL) tablet 650 mg  650 mg Oral Q4H PRN Lewie Chamber, MD  Or  . acetaminophen (TYLENOL) 160 MG/5ML solution 650 mg  650 mg Per Tube Q4H PRN Lewie ChamberGirguis, David, MD       Or  . acetaminophen (TYLENOL) suppository 650 mg  650 mg Rectal Q4H PRN Lewie ChamberGirguis, David, MD      . FLUoxetine (PROZAC) capsule 20 mg  20 mg Oral QHS Maryagnes AmosStarkes-Perry, Takia S, FNP   20 mg at 08/12/19 2120  . [START ON 08/14/2019] folic acid (FOLVITE) tablet 1 mg  1 mg Oral Daily [place]vo[place]ama, Sarina IllGagan S, MD      . hydrALAZINE (APRESOLINE) tablet 10 mg  10 mg Oral Q6H Dorcas CarrowGhimire, Kuber, MD   10 mg at 08/13/19 0651  . labetalol (NORMODYNE) injection 10 mg  10 mg Intravenous Q2H PRN Dorcas CarrowGhimire, Kuber, MD      . levETIRAcetam (KEPPRA) tablet 500 mg  500 mg Oral BID Coletta Memosabbell, Kyle, MD   500 mg at 08/13/19 0927  . multivitamin with minerals tablet 1 tablet  1 tablet Oral Daily Lewie ChamberGirguis, David, MD   1 tablet at 08/13/19 [phone]  . ondansetron (ZOFRAN) tablet 4 mg  4 mg Oral Q8H PRN Lewie ChamberGirguis, David, MD      . pantoprazole (PROTONIX) EC tablet 40 mg  40 mg Oral QHS Sharl MaLama, Sarina IllGagan S, MD      . senna-docusate (Senokot-S) tablet 1 tablet  1 tablet Oral BID Lewie ChamberGirguis, David, MD   1 tablet at 08/13/19 [phone]  . thiamine tablet 100 mg  100 mg Oral Daily Lewie ChamberGirguis, David,  MD   100 mg at 08/13/19 [phone]   Or  . thiamine (B-1) injection 100 mg  100 mg Intravenous Daily Lewie ChamberGirguis, David, MD   100 mg at 2019/06/19 2250    Musculoskeletal: Strength & Muscle Tone:  Did not test Gait & Station: did not witness Patient leans: N/A  Psychiatric Specialty Exam: Physical Exam  Nursing note and vitals reviewed. Constitutional: She is oriented to person, place, and time. She appears well-developed and well-nourished.  HENT:  Head: Normocephalic.  Respiratory: Effort normal.  Musculoskeletal:        General: Normal range of motion.     Cervical back: Normal range of motion.  Neurological: She is alert and oriented to person, place, and time.  Psychiatric: Her speech is normal. Judgment and thought content normal. Her affect is blunt. She is slowed. Cognition and memory are normal. She exhibits a depressed mood.    Review of Systems  Psychiatric/Behavioral: Positive for dysphoric mood.  All other systems reviewed and are negative.   Blood pressure (!) 155/79, pulse 93, temperature 98.1 F (36.7 C), temperature source Oral, resp. rate 20, SpO2 97 %.There is no height or weight on file to calculate BMI.  General Appearance: Fairly Groomed  Eye Contact:  Fair  Speech:  Slow and delayed repsonses at times  Volume:  Normal  Mood:  Denies depression  Affect:  Appears depressed  Thought Process:  Coherent, Linear and Descriptions of Associations: Intact  Orientation:  Other:  Alert and orient x 3.   Thought Content:  Logical  Suicidal Thoughts:  No  Homicidal Thoughts:  No  Memory:  Fair  Judgement:  Fair  Insight:  Fair  Psychomotor Activity:  decreased  Concentration:  Fair  Recall:  Fair  Fund of Knowledge:  Good  Language:  Good  Akathisia:  No  Handed:  Right  AIMS (if indicated):     Assets:  Communication Skills Desire for Improvement Financial Resources/Insurance Leisure Time Physical Health Social Support Transportation  ADL's:  Intact   Cognition:  WNL  Sleep:        Treatment Plan Summary: Major depressive disorder, recurrent, moderate: -discontinue Prozac 20 mg daily -ordered a TSH, vitamin D level, and U/A  Disposition: psychiatrically clear, request her gun be removed from the home by her boyfriend, Roney Mans, prior to discharge to alleviate risks.  Waylan Boga, NP 08/13/2019 3:33 PM

## 2019-08-13 NOTE — Progress Notes (Signed)
Subjective: NAEs o/n  Objective: Vital signs in last 24 hours: Temp:  [98.2 F (36.8 C)-99.3 F (37.4 C)] 98.5 F (36.9 C) (05/09 0814) Pulse Rate:  [77-97] 79 (05/09 1149) Resp:  [14-20] 20 (05/09 1149) BP: (107-155)/(65-93) 128/67 (05/09 1149) SpO2:  [96 %-100 %] 98 % (05/09 1149)  Intake/Output from previous day: 05/08 0701 - 05/09 0700 In: 480 [P.O.:480] Out: -  Intake/Output this shift: Total I/O In: 340 [P.O.:240; IV Piggyback:100] Out: -   Unable to examine patient as patient out of room  Lab Results: Recent Labs    08/07/2019 2216  WBC 9.1  HGB 15.1*  HCT 41.1  PLT 165   BMET Recent Labs    08/12/19 0354 08/13/19 0456  NA 128* 129*  K 4.6 4.0  CL 94* 96*  CO2 19* 21*  GLUCOSE 81 87  BUN 6* 5*  CREATININE 0.54 0.55  CALCIUM 8.5* 8.8*    Studies/Results: CT HEAD WO CONTRAST  Result Date: 08/11/2019 CLINICAL DATA:  Stroke follow-up EXAM: CT HEAD WITHOUT CONTRAST TECHNIQUE: Contiguous axial images were obtained from the base of the skull through the vertex without intravenous contrast. COMPARISON:  Yesterday FINDINGS: Brain: High-density subdural hematoma along the left cerebral convexity measuring up to 10 mm in thickness. There is cortical mass effect and sulcal effacement-midline shift is unchanged at 4 mm. Negative for infarct or hydrocephalus. Mild white matter disease and volume loss. Vascular: Atherosclerotic calcification Skull: Normal. Negative for fracture or focal lesion. Sinuses/Orbits: No acute finding. IMPRESSION: Unchanged subdural hematoma on the left measuring up to 1 cm in thickness. Midline shift is 4 mm. Electronically Signed   By: Marnee Spring M.D.   On: 08/11/2019 16:01    Assessment/Plan: [name] with alcoholism and nonoperative acute SDH. - will need f/u in outpatient setting with Dr. Franky Macho - Keppra x 7 days - ok for pharmacologic DVT prophylaxis if indicated bt patient appears to be fairly ambulatory  Olivia Harris 08/13/2019, 1:09 PM

## 2019-08-13 NOTE — Progress Notes (Signed)
[facility]  PROGRESS NOTE  Olivia Harris TKW:[id number] DOB: [birth year] DOA: 09/01/2019 PCP: Patient, No Pcp Per   Brief HPI:    69 year old female with a history of alcohol abuse, hypertension, depression, anxiety, ADHD who came to ED with complaints of anxiety.  Patient was found to be confused in the ED.  She underwent CT head which showed crescentic hyperdense subdural hemorrhage extending across the cerebral hemispheres most pronounced over the left frontal and temporal lobes along the falx and left tentorium.  Mild mass-effect with global sulcal effacement and left right midline shift of 4 mm.  Neurosurgery saw the patient and recommended no invasive intervention.  Patient also was placed under IVC letter due to suicidal ideation.  As per significant other patient has history of suicidal ideation and has recently got older and has been practicing how to use it.   Subjective   Patient seen and examined, denies any complaints.   Assessment/Plan:     1. Subdural hematoma-patient seen by neurosurgery.  Continue Keppra.  No active neurological deficit.  No invasive intervention planned per neurosurgery. 2. Alcohol abuse/depression/suicidal attempt-patient was IVC by ER physician.  Patient's friend Barnabas Lister says that patient had not gone recently and was practicing week.  Psychiatry has been consulted.  Will await final psych recommendations.  Continue thiamine, folic acid. 3. Hyponatremia-sodium is 129, likely from alcohol abuse.  Urine osmolality is pending. 4. Transaminitis-LFTs elevated in setting of alcohol use.  AST and ALT have improved.     SpO2: 97 %   COVID-19 Labs  No results for input(s): DDIMER, FERRITIN, LDH, CRP in the last 72 hours.  Lab Results  Component Value Date   Ferris NEGATIVE 09/04/2019     CBG: No results for input(s): GLUCAP in the last 168 hours.  CBC: Recent Labs  Lab 08/19/2019 2216  WBC 9.1  NEUTROABS 7.4  HGB 15.1*  HCT 41.1  MCV  94.7  PLT 924    Basic Metabolic Panel: Recent Labs  Lab 08/11/19 0005 08/11/19 0516 08/11/19 1627 08/12/19 0354 08/13/19 0456  NA 128* 129* 128* 128* 129*  K 3.1* 3.0* 3.5 4.6 4.0  CL 91* 94* 93* 94* 96*  CO2 21* 21* 21* 19* 21*  GLUCOSE 114* 105* 99 81 87  BUN 5* 5* 7* 6* 5*  CREATININE 0.52 0.47 0.61 0.54 0.55  CALCIUM 8.5* 8.2* 8.7* 8.5* 8.8*  MG  --   --  1.7  --   --   PHOS  --   --  2.4*  --   --      Liver Function Tests: Recent Labs  Lab 08/09/2019 2216 08/13/19 0456  AST 83* 36  ALT 173* 76*  ALKPHOS 72 57  BILITOT 2.1* 1.5*  PROT 7.1 5.7*  ALBUMIN 4.4 3.1*        DVT prophylaxis: SCDs  Code Status: Full code  Family Communication: No family at bedside  Disposition Plan:   Status is: Inpatient  Dispo: The patient is from: Home              Anticipated d/c is to: Home versus inpatient psych              Anticipated d/c date is:08/16/2019              Patient currently admitted for subdural hematoma, alcohol abuse, suicidal ideation.  Prior to discharge-patient will likely need inpatient psych.  Will obtain psychiatric consultation.        Scheduled medications:  .  stroke: mapping our early stages of recovery book   Does not apply Once  . FLUoxetine  20 mg Oral QHS  . [START ON 08/14/2019] folic acid  1 mg Oral Daily  . hydrALAZINE  10 mg Oral Q6H  . levETIRAcetam  500 mg Oral BID  . multivitamin with minerals  1 tablet Oral Daily  . pantoprazole  40 mg Oral QHS  . senna-docusate  1 tablet Oral BID  . thiamine  100 mg Oral Daily   Or  . thiamine  100 mg Intravenous Daily    Consultants:  Neurosurgery  Procedures:    Antibiotics:   Anti-infectives (From admission, onward)   None       Objective   Vitals:   08/13/19 1043 08/13/19 1149 08/13/19 1312 08/13/19 1441  BP: (!) 151/75 128/67 (!) 161/98 (!) 155/79  Pulse: 92 79 89 93  Resp: 18 20    Temp:   98.1 F (36.7 C)   TempSrc:   Oral   SpO2: 96% 98% 98% 97%     Intake/Output Summary (Last 24 hours) at 08/13/2019 1500 Last data filed at 08/13/2019 1310 Gross per 24 hour  Intake 820 ml  Output --  Net 820 ml    05/07 1901 - 05/09 0700 In: 840 [P.O.:840] Out: -   There were no vitals filed for this visit.  Physical Examination:    General-appears in no acute distress  Heart-S1-S2, regular, no murmur auscultated  Lungs-clear to auscultation bilaterally, no wheezing or crackles auscultated  Abdomen-soft, nontender, no organomegaly  Extremities-no edema in the lower extremities  Neuro-alert, oriented x3, no focal deficit noted    Data Reviewed:   Recent Results (from the past 240 hour(s))  Respiratory Panel by RT PCR (Flu A&B, Covid) - Nasopharyngeal Swab     Status: None   Collection Time: 08/28/2019 10:16 PM   Specimen: Nasopharyngeal Swab  Result Value Ref Range Status   SARS Coronavirus 2 by RT PCR NEGATIVE NEGATIVE Final    Comment: (NOTE) SARS-CoV-2 target nucleic acids are NOT DETECTED. The SARS-CoV-2 RNA is generally detectable in upper respiratoy specimens during the acute phase of infection. The lowest concentration of SARS-CoV-2 viral copies this assay can detect is 131 copies/mL. A negative result does not preclude SARS-Cov-2 infection and should not be used as the sole basis for treatment or other patient management decisions. A negative result may occur with  improper specimen collection/handling, submission of specimen other than nasopharyngeal swab, presence of viral mutation(s) within the areas targeted by this assay, and inadequate number of viral copies (<131 copies/mL). A negative result must be combined with clinical observations, patient history, and epidemiological information. The expected result is Negative. Fact Sheet for Patients:  https://www.moore.com/ Fact Sheet for Healthcare Providers:  https://www.young.biz/ This test is not yet ap proved or cleared by  the [place] FDA and  has been authorized for detection and/or diagnosis of SARS-CoV-2 by FDA under an Emergency Use Authorization (EUA). This EUA will remain  in effect (meaning this test can be used) for the duration of the COVID-19 declaration under Section 564(b)(1) of the Act, 21 U.S.C. section 360bbb-3(b)(1), unless the authorization is terminated or revoked sooner.    Influenza A by PCR NEGATIVE NEGATIVE Final   Influenza B by PCR NEGATIVE NEGATIVE Final    Comment: (NOTE) The Xpert Xpress SARS-CoV-2/FLU/RSV assay is intended as an aid in  the diagnosis of influenza from Nasopharyngeal swab specimens and  should not be used as a  sole basis for treatment. Nasal washings and  aspirates are unacceptable for Xpert Xpress SARS-CoV-2/FLU/RSV  testing. Fact Sheet for Patients: https://www.moore.com/ Fact Sheet for Healthcare Providers: https://www.young.biz/ This test is not yet approved or cleared by the [place] FDA and  has been authorized for detection and/or diagnosis of SARS-CoV-2 by  FDA under an Emergency Use Authorization (EUA). This EUA will remain  in effect (meaning this test can be used) for the duration of the  Covid-19 declaration under Section 564(b)(1) of the Act, 21  U.S.C. section 360bbb-3(b)(1), unless the authorization is  terminated or revoked. Performed at [facility], [address], [place], [place] [address]     Recent Labs  Lab 08/15/2019 2216  LIPASE 27   Recent Labs  Lab 08/13/2019 2216  AMMONIA 25     Studies:  CT HEAD WO CONTRAST  Result Date: 08/11/2019 CLINICAL DATA:  Stroke follow-up EXAM: CT HEAD WITHOUT CONTRAST TECHNIQUE: Contiguous axial images were obtained from the base of the skull through the vertex without intravenous contrast. COMPARISON:  Yesterday FINDINGS: Brain: High-density subdural hematoma along the left cerebral convexity measuring up to 10 mm in thickness.  There is cortical mass effect and sulcal effacement-midline shift is unchanged at 4 mm. Negative for infarct or hydrocephalus. Mild white matter disease and volume loss. Vascular: Atherosclerotic calcification Skull: Normal. Negative for fracture or focal lesion. Sinuses/Orbits: No acute finding. IMPRESSION: Unchanged subdural hematoma on the left measuring up to 1 cm in thickness. Midline shift is 4 mm. Electronically Signed   By: Marnee Spring M.D.   On: 08/11/2019 16:01       Alexias Margerum S Taccara Bushnell   [facility] If 7PM-7AM, please contact night-coverage at [email or web address], Office  [phone]   08/13/2019, 3:00 PM  LOS: 3 days

## 2019-08-13 NOTE — Plan of Care (Signed)
  Problem: Nutrition: Goal: Risk of aspiration will decrease Outcome: Progressing   Problem: Education: Goal: Knowledge of General Education information will improve Description: Including pain rating scale, medication(s)/side effects and non-pharmacologic comfort measures Outcome: Progressing   Problem: Nutrition: Goal: Adequate nutrition will be maintained Outcome: Progressing   Problem: Activity: Goal: Risk for activity intolerance will decrease Outcome: Progressing   Problem: Safety: Goal: Ability to remain free from injury will improve Outcome: Progressing

## 2019-08-13 NOTE — Progress Notes (Signed)
Patient refuses telemetry monitor.

## 2019-08-14 LAB — URINALYSIS, ROUTINE W REFLEX MICROSCOPIC
Bilirubin Urine: NEGATIVE
Glucose, UA: NEGATIVE mg/dL
Hgb urine dipstick: NEGATIVE
Ketones, ur: 80 mg/dL — AB
Leukocytes,Ua: NEGATIVE
Nitrite: NEGATIVE
Protein, ur: NEGATIVE mg/dL
Specific Gravity, Urine: 1.012 (ref 1.005–1.030)
pH: 6 (ref 5.0–8.0)

## 2019-08-14 LAB — OSMOLALITY, URINE: Osmolality, Ur: 467 mOsm/kg (ref 300–900)

## 2019-08-14 NOTE — Progress Notes (Signed)
PROGRESS NOTE  Olivia Harris ION:[id number] DOB: 04-30-1950 DOA: 08-16-19 PCP: Patient, No Pcp Per  HPI/Recap of past 39 hours: 69 year old female with a history of alcohol abuse, hypertension, depression, anxiety, ADHD who came to ED with complaints of anxiety.  Patient was found to be confused in the ED.  She underwent CT head which showed crescentic hyperdense subdural hemorrhage extending across the cerebral hemispheres most pronounced over the left frontal and temporal lobes along the falx and left tentorium.  Mild mass-effect with global sulcal effacement and left right midline shift of 4 mm.  Neurosurgery saw the patient and recommended no invasive intervention.  Patient also was placed under IVC letter due to suicidal ideation.  As per significant other patient has history of suicidal ideation and has recently got older and has been practicing how to use it.  08/14/19:  Seen and examined.  She is alert and oriented x 3.  She has no new complaints this AM.  Sitter at bedside.   Assessment/Plan: Principal Problem:   Subdural hematoma (HCC) Active Problems:   Major depressive disorder, single episode, moderate degree (HCC)   Alcohol abuse   Hyponatremia   1. Subdural hematoma-patient seen by neurosurgery.  Continue Keppra.  No active neurological deficit.  No invasive intervention planned per neurosurgery. 2. Alcohol abuse/depression/suicidal attempt-patient was IVC by ER physician.  Patient's friend Barnabas Lister says that patient had not gone recently and was practicing week.  Psychiatry has been consulted.  Will await final psych recommendations.  Continue thiamine, folic acid. 3. Hypovolemic Hyponatremia-sodium is 129, likely from alcohol abuse.  Urine osmolality is pending. 4. Transaminitis-LFTs elevated in setting of alcohol use.  AST and ALT have improved and continue to trend down. 5. Hypokalemia, resolved, post repletion 6. Hyperbilirubinemia, likely in the setting of acute  illness, trending down  DVT prophylaxis: SCDs  Code Status: Full code  Family Communication: No family at bedside  Disposition Plan:     Status is: Inpatient   Dispo: The patient is from: Home              Anticipated d/c is to: Home; cleared by psych               Anticipated d/c date is: 08/16/19               Patient currently would be unsafe discharge to home at this time.         Objective: Vitals:   08/13/19 2031 08/13/19 2314 08/14/19 0356 08/14/19 0739  BP: (!) 162/92 (!) 165/106 (!) 149/95 (!) 160/93  Pulse: 93 93 95 90  Resp: 18 17 18 20   Temp: 98.3 F (36.8 C) 98.5 F (36.9 C) 98.7 F (37.1 C) (!) 97.3 F (36.3 C)  TempSrc: Oral Oral Oral Oral  SpO2: 97% 98% 99% 100%    Intake/Output Summary (Last 24 hours) at 08/14/2019 0749 Last data filed at 08/13/2019 1741 Gross per 24 hour  Intake 940 ml  Output --  Net 940 ml   There were no vitals filed for this visit.  Exam:  . General: 69 y.o. year-old female well developed well nourished in no acute distress.  Alert and oriented x3. . Cardiovascular: Regular rate and rhythm with no rubs or gallops.  No thyromegaly or JVD noted.   [name] Respiratory: Clear to auscultation with no wheezes or rales. Good inspiratory effort. . Abdomen: Soft nontender nondistended with normal bowel sounds x4 quadrants. . Musculoskeletal: No lower extremity edema. 2/4 pulses  in all 4 extremities. [name] Psychiatry: Mood is appropriate for condition and setting   Data Reviewed: CBC: Recent Labs  Lab 09/02/2019 2216  WBC 9.1  NEUTROABS 7.4  HGB 15.1*  HCT 41.1  MCV 94.7  PLT 165   Basic Metabolic Panel: Recent Labs  Lab 08/11/19 0005 08/11/19 0516 08/11/19 1627 08/12/19 0354 08/13/19 0456  NA 128* 129* 128* 128* 129*  K 3.1* 3.0* 3.5 4.6 4.0  CL 91* 94* 93* 94* 96*  CO2 21* 21* 21* 19* 21*  GLUCOSE 114* 105* 99 81 87  BUN 5* 5* 7* 6* 5*  CREATININE 0.52 0.47 0.61 0.54 0.55  CALCIUM 8.5* 8.2* 8.7* 8.5* 8.8*  MG   --   --  1.7  --   --   PHOS  --   --  2.4*  --   --    GFR: CrCl cannot be calculated (Unknown ideal weight.). Liver Function Tests: Recent Labs  Lab 09/03/2019 2216 08/13/19 0456  AST 83* 36  ALT 173* 76*  ALKPHOS 72 57  BILITOT 2.1* 1.5*  PROT 7.1 5.7*  ALBUMIN 4.4 3.1*   Recent Labs  Lab 09/04/2019 2216  LIPASE 27   Recent Labs  Lab 08/08/2019 2216  AMMONIA 25   Coagulation Profile: Recent Labs  Lab 09/04/2019 2216  INR 0.9   Cardiac Enzymes: No results for input(s): CKTOTAL, CKMB, CKMBINDEX, TROPONINI in the last 168 hours. BNP (last 3 results) No results for input(s): PROBNP in the last 8760 hours. HbA1C: No results for input(s): HGBA1C in the last 72 hours. CBG: No results for input(s): GLUCAP in the last 168 hours. Lipid Profile: No results for input(s): CHOL, HDL, LDLCALC, TRIG, CHOLHDL, LDLDIRECT in the last 72 hours. Thyroid Function Tests: Recent Labs    08/13/19 1634  TSH 1.620   Anemia Panel: No results for input(s): VITAMINB12, FOLATE, FERRITIN, TIBC, IRON, RETICCTPCT in the last 72 hours. Urine analysis:    Component Value Date/Time   COLORURINE YELLOW 08/14/2019 0407   APPEARANCEUR CLEAR 08/14/2019 0407   LABSPEC 1.012 08/14/2019 0407   PHURINE 6.0 08/14/2019 0407   GLUCOSEU NEGATIVE 08/14/2019 0407   HGBUR NEGATIVE 08/14/2019 0407   BILIRUBINUR NEGATIVE 08/14/2019 0407   BILIRUBINUR neg 09/25/2010 1503   KETONESUR 80 (A) 08/14/2019 0407   PROTEINUR NEGATIVE 08/14/2019 0407   UROBILINOGEN neg 09/25/2010 1503   NITRITE NEGATIVE 08/14/2019 0407   LEUKOCYTESUR NEGATIVE 08/14/2019 0407   Sepsis Labs: @LABRCNTIP (procalcitonin:4,lacticidven:4)  ) Recent Results (from the past 240 hour(s))  Respiratory Panel by RT PCR (Flu A&B, Covid) - Nasopharyngeal Swab     Status: None   Collection Time: 09/02/2019 10:16 PM   Specimen: Nasopharyngeal Swab  Result Value Ref Range Status   SARS Coronavirus 2 by RT PCR NEGATIVE NEGATIVE Final    Comment:  (NOTE) SARS-CoV-2 target nucleic acids are NOT DETECTED. The SARS-CoV-2 RNA is generally detectable in upper respiratoy specimens during the acute phase of infection. The lowest concentration of SARS-CoV-2 viral copies this assay can detect is 131 copies/mL. A negative result does not preclude SARS-Cov-2 infection and should not be used as the sole basis for treatment or other patient management decisions. A negative result may occur with  improper specimen collection/handling, submission of specimen other than nasopharyngeal swab, presence of viral mutation(s) within the areas targeted by this assay, and inadequate number of viral copies (<131 copies/mL). A negative result must be combined with clinical observations, patient history, and epidemiological information. The expected result is Negative.  Fact Sheet for Patients:  https://www.moore.com/ Fact Sheet for Healthcare Providers:  https://www.young.biz/ This test is not yet ap proved or cleared by the [place] FDA and  has been authorized for detection and/or diagnosis of SARS-CoV-2 by FDA under an Emergency Use Authorization (EUA). This EUA will remain  in effect (meaning this test can be used) for the duration of the COVID-19 declaration under Section 564(b)(1) of the Act, 21 U.S.C. section 360bbb-3(b)(1), unless the authorization is terminated or revoked sooner.    Influenza A by PCR NEGATIVE NEGATIVE Final   Influenza B by PCR NEGATIVE NEGATIVE Final    Comment: (NOTE) The Xpert Xpress SARS-CoV-2/FLU/RSV assay is intended as an aid in  the diagnosis of influenza from Nasopharyngeal swab specimens and  should not be used as a sole basis for treatment. Nasal washings and  aspirates are unacceptable for Xpert Xpress SARS-CoV-2/FLU/RSV  testing. Fact Sheet for Patients: https://www.moore.com/ Fact Sheet for Healthcare  Providers: https://www.young.biz/ This test is not yet approved or cleared by the [place] FDA and  has been authorized for detection and/or diagnosis of SARS-CoV-2 by  FDA under an Emergency Use Authorization (EUA). This EUA will remain  in effect (meaning this test can be used) for the duration of the  Covid-19 declaration under Section 564(b)(1) of the Act, 21  U.S.C. section 360bbb-3(b)(1), unless the authorization is  terminated or revoked. Performed at [facility], [address] [place], [place] [address]       Studies: No results found.  Scheduled Meds: .  stroke: mapping our early stages of recovery book   Does not apply Once  . folic acid  1 mg Oral Daily  . hydrALAZINE  10 mg Oral Q6H  . levETIRAcetam  500 mg Oral BID  . multivitamin with minerals  1 tablet Oral Daily  . pantoprazole  40 mg Oral QHS  . senna-docusate  1 tablet Oral BID  . thiamine  100 mg Oral Daily   Or  . thiamine  100 mg Intravenous Daily    Continuous Infusions:   LOS: 4 days     Darlin Drop, MD [facility] Pager [phone]  If 7PM-7AM, please contact night-coverage [email or web address] Password [facility] 08/14/2019, 7:49 AM
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
## 2019-08-14 NOTE — Progress Notes (Signed)
Patient ID: Olivia Harris, female   DOB: 12-31-1950, 69 y.o.   MRN: [id number] BP (!) 151/96 (BP Location: Right Arm)   Pulse 88   Temp 98.5 F (36.9 C) (Oral)   Resp 18   SpO2 98%  Alert, oriented x 3, speech is clear and fluent Perrl, full eom No drift on exam  moving all extremities well Will continue to monitor, improved

## 2019-08-14 NOTE — Progress Notes (Signed)
Physical Therapy Treatment Patient Details Name: Olivia Harris MRN: [id number] DOB: 1950/06/22 Today's Date: 08/14/2019    History of Present Illness Pt is a 69 y/o female admitted secondary to AMS and suicidal ideation. CT head subdural hemorrhage extending across the cerebral hemispheres most pronounced over the left frontal and temporal lobes and left right midline shift of 4 mm. PMH includes alcohol abuse, anxiety and scoliosis.     PT Comments    Patient calling out to go to the bathroom and noted trying to get to sitting on side of bed. Assisted patient to walk to bathroom with RW (pt with continued difficulty with proximity to RW, running into objects on her left despite vc's). On return from bathroom, did not use a device and pt noted to reach out for UE support on furniture (including rolling table despite warning not to). Performed LE exercises and noted good bil LE strength and now suspect earlier knee buckling due to inattention to LLE. Assisted back to bed and again oriented to call light and how to call for assistance. Patient return demonstrated ability to push button.     Follow Up Recommendations  SNF;Supervision/Assistance - 24 hour     Equipment Recommendations  Other (comment)(TBD)    Recommendations for Other Services       Precautions / Restrictions Precautions Precautions: Fall Precaution Comments:  Restrictions Weight Bearing Restrictions: No    Mobility  Bed Mobility Overal bed mobility: Needs Assistance Bed Mobility: Supine to Sit     Supine to sit: Supervision Sit to supine: Supervision   General bed mobility comments: HOB elevated, with rail  Transfers Overall transfer level: Needs assistance Equipment used: Rolling walker (2 wheeled);None Transfers: Sit to/from Stand Sit to Stand: Min guard;Min assist         General transfer comment: vc for sequencing/safety with RW; second transfer she pushed RW out of her way, min assist for  balance with sit to stand  Ambulation/Gait Ambulation/Gait assistance: Mod assist;Min assist Gait Distance (Feet): 15 Feet(x 2) Assistive device: Rolling walker (2 wheeled);None Gait Pattern/deviations: Step-through pattern;Decreased stride length;Trunk flexed;Drifts right/left Gait velocity: Decreased   General Gait Details: Flexed trunk and pushes RW too far ahead with difficulty obtaining correct posture and proximity to RW (despite verbal and tactile cues) Ran into objects on her left; second walk with no device with pt reaching to hold onto furniture and min steadying assist.    Stairs             Wheelchair Mobility    Modified Rankin (Stroke Patients Only) Modified Rankin (Stroke Patients Only) Pre-Morbid Rankin Score: No symptoms Modified Rankin: Moderately severe disability     Balance Overall balance assessment: Needs assistance Sitting-balance support: No upper extremity supported;Feet supported Sitting balance-Leahy Scale: Fair     Standing balance support: No upper extremity supported;During functional activity Standing balance-Leahy Scale: Poor Standing balance comment: washing hands at sink; left lean                            Cognition Arousal/Alertness: Awake/alert Behavior During Therapy: Flat affect Overall Cognitive Status: Impaired/Different from baseline Area of Impairment: Orientation;Attention;Memory;Awareness;Following commands;Problem solving;Safety/judgement                 Orientation Level: Disoriented to;Place;Time;Situation Current Attention Level: Sustained Memory: Decreased short-term memory Following Commands: Follows multi-step commands inconsistently;Follows one step commands with increased time Safety/Judgement: Decreased awareness of safety;Decreased awareness of deficits(attempting to get OOB  alone) Awareness: Intellectual Problem Solving: Slow processing;Decreased initiation;Difficulty sequencing;Requires  verbal cues;Requires tactile cues General Comments: calling out to use the restroom and noted trying to get OOB alone      Exercises Other Exercises Other Exercises: at counter with light bil UE support: squats x 10 with good form (demonstrated technique by PT) and no knee buckling; appears knee buckling with gait may be due to inattention    General Comments General comments (skin integrity, edema, etc.): No longer on suicide precautions      Pertinent Vitals/Pain Pain Assessment: No/denies pain    Home Living                      Prior Function            PT Goals (current goals can now be found in the care plan section) Acute Rehab PT Goals Patient Stated Goal: to go home Time For Goal Achievement: 08/25/19 Potential to Achieve Goals: Good Progress towards PT goals: Progressing toward goals    Frequency    Min 2X/week      PT Plan Current plan remains appropriate    Co-evaluation              AM-PAC PT "6 Clicks" Mobility   Outcome Measure  Help needed turning from your back to your side while in a flat bed without using bedrails?: None Help needed moving from lying on your back to sitting on the side of a flat bed without using bedrails?: A Little Help needed moving to and from a bed to a chair (including a wheelchair)?: A Little Help needed standing up from a chair using your arms (e.g., wheelchair or bedside chair)?: A Little Help needed to walk in hospital room?: A Little Help needed climbing 3-5 steps with a railing? : A Lot 6 Click Score: 18    End of Session Equipment Utilized During Treatment: Gait belt Activity Tolerance: Patient tolerated treatment well Patient left: in bed;with call bell/phone within reach;with bed alarm set Nurse Communication: Mobility status;Other (comment)(voided; found trying to get up on her own) PT Visit Diagnosis: Unsteadiness on feet (R26.81);Muscle weakness (generalized) (M62.81)     Time:  9622-2979 PT Time Calculation (min) (ACUTE ONLY): 10 min  Charges:  $Gait Training: 8-22 mins $Therapeutic Activity: 8-22 mins                      Arby Barrette, PT Pager [phone]    Rexanne Mano 08/14/2019, 4:51 PM

## 2019-08-14 NOTE — Social Work (Signed)
Pt cleared by psychiatry team. Notice of Commitment Change needs to be signed by MD. MD contacted for this.  Will fax to clerk when completed at [phone]   Octavio Graves, MSW, LCSW [facility]

## 2019-08-14 NOTE — Progress Notes (Signed)
Physical Therapy Treatment Patient Details Name: Olivia Harris MRN: [id number] DOB: [birth year] Today's Date: 08/14/2019    History of Present Illness Pt is a 69 y/o female admitted secondary to AMS and suicidal ideation. CT head subdural hemorrhage extending across the cerebral hemispheres most pronounced over the left frontal and temporal lobes and left right midline shift of 4 mm. PMH includes alcohol abuse, anxiety and scoliosis.     PT Comments    Patient agrees to OOB and walking but clearly anxious and easily distracted when working in hallway. Maintains trunk flexion (denies back pain) and noted bil LE weakness. She consistently runs into objects on her left, in part due to left UE weakness and ?decr vision.     Follow Up Recommendations  SNF;Supervision/Assistance - 24 hour     Equipment Recommendations  Other (comment)(TBD)    Recommendations for Other Services       Precautions / Restrictions Precautions Precautions: Fall;Other (comment) Precaution Comments: suicide precautions Restrictions Weight Bearing Restrictions: No    Mobility  Bed Mobility Overal bed mobility: Needs Assistance Bed Mobility: Supine to Sit     Supine to sit: Supervision Sit to supine: Supervision   General bed mobility comments: HOB elevated  Transfers Overall transfer level: Needs assistance Equipment used: Rolling walker (2 wheeled) Transfers: Sit to/from Stand Sit to Stand: Min guard         General transfer comment: vc for sequencing/safety with RW  Ambulation/Gait Ambulation/Gait assistance: Mod assist Gait Distance (Feet): 15 Feet(x2; seated rest 80 ft; ) Assistive device: Rolling walker (2 wheeled) Gait Pattern/deviations: Step-through pattern;Decreased stride length;Trunk flexed;Drifts right/left Gait velocity: Decreased   General Gait Details: Flexed trunk and pushes RW too far ahead with difficulty obtaining correct posture and proximity to RW (despite verbal  and tactile cues) Ran into objects on her left x 5   Stairs             Wheelchair Mobility    Modified Rankin (Stroke Patients Only) Modified Rankin (Stroke Patients Only) Pre-Morbid Rankin Score: No symptoms Modified Rankin: Moderately severe disability     Balance Overall balance assessment: Needs assistance Sitting-balance support: No upper extremity supported;Feet supported Sitting balance-Leahy Scale: Fair     Standing balance support: No upper extremity supported;During functional activity Standing balance-Leahy Scale: Poor Standing balance comment: washing hands at sink; left lean                            Cognition Arousal/Alertness: Awake/alert Behavior During Therapy: Anxious Overall Cognitive Status: Impaired/Different from baseline Area of Impairment: Orientation;Attention;Memory;Awareness;Following commands;Problem solving                 Orientation Level: Disoriented to;Place;Time;Situation Current Attention Level: Sustained Memory: Decreased short-term memory Following Commands: Follows multi-step commands inconsistently;Follows one step commands with increased time   Awareness: Intellectual Problem Solving: Slow processing;Decreased initiation;Difficulty sequencing;Requires verbal cues;Requires tactile cues General Comments: Patient stated she needed to use the bathroom. She was unaware that she did not pull down her pants and underwear enough and even with cues to correct she required assist to avoid soiling them.       Exercises      General Comments General comments (skin integrity, edema, etc.): Friend, Joe, arrived mid-session and pt very distracted by his presence with need for incr assist to manuever RW      Pertinent Vitals/Pain Pain Assessment: No/denies pain    Home Living  Prior Function            PT Goals (current goals can now be found in the care plan section) Acute Rehab  PT Goals Patient Stated Goal: to go home Time For Goal Achievement: 08/25/19 Potential to Achieve Goals: Good Progress towards PT goals: Progressing toward goals    Frequency    Min 2X/week      PT Plan Discharge plan needs to be updated;Frequency needs to be updated    Co-evaluation              AM-PAC PT "6 Clicks" Mobility   Outcome Measure  Help needed turning from your back to your side while in a flat bed without using bedrails?: None Help needed moving from lying on your back to sitting on the side of a flat bed without using bedrails?: A Little Help needed moving to and from a bed to a chair (including a wheelchair)?: A Little   Help needed to walk in hospital room?: A Little Help needed climbing 3-5 steps with a railing? : A Lot 6 Click Score: 15    End of Session Equipment Utilized During Treatment: Gait belt Activity Tolerance: Patient tolerated treatment well Patient left: in bed;with call bell/phone within reach;with family/visitor present;with nursing/sitter in room Nurse Communication: Mobility status;Other (comment)(voided) PT Visit Diagnosis: Unsteadiness on feet (R26.81);Muscle weakness (generalized) (M62.81)     Time: 2423-5361 PT Time Calculation (min) (ACUTE ONLY): 19 min  Charges:  $Gait Training: 8-22 mins                      Olivia Harris, PT Pager [phone]    Olivia Harris 08/14/2019, 2:23 PM

## 2019-08-15 LAB — COMPREHENSIVE METABOLIC PANEL
ALT: 62 U/L — ABNORMAL HIGH (ref 0–44)
AST: 40 U/L (ref 15–41)
Albumin: 3.1 g/dL — ABNORMAL LOW (ref 3.5–5.0)
Alkaline Phosphatase: 62 U/L (ref 38–126)
Anion gap: 11 (ref 5–15)
BUN: <5 mg/dL — ABNORMAL LOW (ref 8–23)
CO2: 26 mmol/L (ref 22–32)
Calcium: 8.8 mg/dL — ABNORMAL LOW (ref 8.9–10.3)
Chloride: 94 mmol/L — ABNORMAL LOW (ref 98–111)
Creatinine, Ser: 0.5 mg/dL (ref 0.44–1.00)
GFR calc Af Amer: >60 mL/min (ref >60)
GFR calc non Af Amer: >60 mL/min (ref >60)
Glucose, Bld: 92 mg/dL (ref 70–99)
Potassium: 3.3 mmol/L — ABNORMAL LOW (ref 3.5–5.1)
Sodium: 131 mmol/L — ABNORMAL LOW (ref 135–145)
Total Bilirubin: 1.3 mg/dL — ABNORMAL HIGH (ref 0.3–1.2)
Total Protein: 5.6 g/dL — ABNORMAL LOW (ref 6.5–8.1)

## 2019-08-15 LAB — CBC WITH DIFFERENTIAL/PLATELET
Abs Immature Granulocytes: 0.07 10*3/uL (ref 0.00–0.07)
Basophils Absolute: 0 10*3/uL (ref 0.0–0.1)
Basophils Relative: 1 %
Eosinophils Absolute: 0.1 10*3/uL (ref 0.0–0.5)
Eosinophils Relative: 1 %
HCT: 34.8 % — ABNORMAL LOW (ref 36.0–46.0)
Hemoglobin: 12.4 g/dL (ref 12.0–15.0)
Immature Granulocytes: 2 %
Lymphocytes Relative: 14 %
Lymphs Abs: 0.6 10*3/uL — ABNORMAL LOW (ref 0.7–4.0)
MCH: 34 pg (ref 26.0–34.0)
MCHC: 35.6 g/dL (ref 30.0–36.0)
MCV: 95.3 fL (ref 80.0–100.0)
Monocytes Absolute: 1 10*3/uL (ref 0.1–1.0)
Monocytes Relative: 24 %
Neutro Abs: 2.3 10*3/uL (ref 1.7–7.7)
Neutrophils Relative %: 58 %
Platelets: 214 10*3/uL (ref 150–400)
RBC: 3.65 MIL/uL — ABNORMAL LOW (ref 3.87–5.11)
RDW: 12.8 % (ref 11.5–15.5)
WBC: 4 10*3/uL (ref 4.0–10.5)
nRBC: 0 % (ref 0.0–0.2)

## 2019-08-15 LAB — VITAMIN D 25 HYDROXY (VIT D DEFICIENCY, FRACTURES): Vit D, 25-Hydroxy: 30.9 ng/mL (ref 30–100)

## 2019-08-15 LAB — VITAMIN B1: Vitamin B1 (Thiamine): 174.5 nmol/L (ref 66.5–200.0)

## 2019-08-15 NOTE — Progress Notes (Signed)
Occupational Therapy Treatment Patient Details Name: Olivia Harris MRN: [id number] DOB: 09/21/1950 Today's Date: 08/15/2019    History of present illness Pt is a 69 y/o female admitted secondary to AMS and suicidal ideation. CT head subdural hemorrhage extending across the cerebral hemispheres most pronounced over the left frontal and temporal lobes and left right midline shift of 4 mm. PMH includes alcohol abuse, anxiety and scoliosis.    OT comments  Pt demonstrates a smile this session talking about ice cream in [place] with Barnabas Lister. Pt dragging LLE with noticeable weakness compared to R LE. Pt fatigues quickly and needs frequent rest breaks. Recommend DC to snf at this time.   Follow Up Recommendations  Other (comment)    Equipment Recommendations  None recommended by OT    Recommendations for Other Services Other (comment)    Precautions / Restrictions Precautions Precautions: Fall Precaution Comments: d/c sitter and psych cleared for SNF       Mobility Bed Mobility Overal bed mobility: Needs Assistance Bed Mobility: Supine to Sit;Sit to Supine     Supine to sit: Supervision Sit to supine: Supervision   General bed mobility comments: hob elevated and multiple cues to initiate  Transfers Overall transfer level: Needs assistance Equipment used: 1 person hand held assist Transfers: Sit to/from Stand Sit to Stand: Min assist         General transfer comment: pt with L LE more noticeable with transfers this sesion. pt required x3 rest breaks to transfer to RN station and back     Balance Overall balance assessment: Needs assistance         Standing balance support: Bilateral upper extremity supported;During functional activity Standing balance-Leahy Scale: Poor                             ADL either performed or assessed with clinical judgement   ADL Overall ADL's : Needs assistance/impaired                                              Vision       Perception     Praxis      Cognition Arousal/Alertness: Awake/alert Behavior During Therapy: Flat affect Overall Cognitive Status: Impaired/Different from baseline Area of Impairment: Orientation;Attention;Memory;Following commands;Safety/judgement;Awareness;Problem solving                 Orientation Level: Disoriented to;Time;Situation Current Attention Level: Sustained Memory: Decreased recall of precautions;Decreased short-term memory Following Commands: Follows one step commands inconsistently;Follows one step commands with increased time Safety/Judgement: Decreased awareness of safety;Decreased awareness of deficits Awareness: Intellectual Problem Solving: Slow processing;Decreased initiation;Requires verbal cues;Difficulty sequencing;Requires tactile cues General Comments: pt able to tell visitors name but unable to describe things they may do together. "jack" present states we have gone to [place] 3 times together. pt giving very vague review of trip and jack giving very detailed review. pt smiled when OT mentioned Gelto. only change of expression in any session. jack state "she loves that stuff" Pt unable to recall reason for admission or what deficits are occuring. pt with delayed responses to questions with blank stare at therapist.         Exercises Other Exercises Other Exercises: pt bridging, quad sets, ankle pumps, hip flexors during this session x10 reps each. pt with shoulder flexion x10 reps. pt  attempting to return to supine with cues every 15 seconds to remain eob. pt no recalll and trying to lay down   Shoulder Instructions       General Comments      Pertinent Vitals/ Pain       Pain Assessment: No/denies pain  Home Living     Available Help at Discharge: Friend(s);Skilled Nursing Facility Type of Home: House                              Lives With: Alone    Prior Functioning/Environment               Frequency  Min 2X/week        Progress Toward Goals  OT Goals(current goals can now be found in the care plan section)  Progress towards OT goals: Progressing toward goals  Acute Rehab OT Goals Patient Stated Goal: to go home OT Goal Formulation: Patient unable to participate in goal setting Time For Goal Achievement: 08/26/19 Potential to Achieve Goals: Good ADL Goals Pt Will Perform Grooming: with modified independence;sitting Pt Will Perform Upper Body Bathing: with modified independence;sitting Pt Will Perform Lower Body Bathing: with modified independence;sit to/from stand Pt Will Perform Upper Body Dressing: with modified independence;sitting Pt Will Perform Lower Body Dressing: with modified independence;sit to/from stand Pt Will Transfer to Toilet: with supervision;ambulating Additional ADL Goal #1: pt will complete 5 step pathfinding task supervision level  Plan Discharge plan remains appropriate    Co-evaluation                 AM-PAC OT "6 Clicks" Daily Activity     Outcome Measure   Help from another person eating meals?: A Little Help from another person taking care of personal grooming?: A Little Help from another person toileting, which includes using toliet, bedpan, or urinal?: A Little Help from another person bathing (including washing, rinsing, drying)?: A Little Help from another person to put on and taking off regular upper body clothing?: A Little Help from another person to put on and taking off regular lower body clothing?: A Little 6 Click Score: 18    End of Session    OT Visit Diagnosis: Unsteadiness on feet (R26.81);Muscle weakness (generalized) (M62.81)   Activity Tolerance Patient tolerated treatment well   Patient Left in bed;with call bell/phone within reach;with bed alarm set;with family/visitor present   Nurse Communication Mobility status;Precautions        Time: 1422(1422)-1443 OT Time Calculation (min): 21  min  Charges: OT General Charges $OT Visit: 1 Visit OT Treatments $Therapeutic Exercise: 8-22 mins   Brynn, OTR/L  Acute Rehabilitation Services Pager: [phone] Office: [phone] .    Mateo Flow 08/15/2019, 4:32 PM

## 2019-08-15 NOTE — Progress Notes (Signed)
PROGRESS NOTE  Olivia Harris BLT:[id number] DOB: Mar 10, 1951 DOA: 2019-09-02 PCP: Patient, No Pcp Per  HPI/Recap of past 29 hours: 69 year old female with a history of alcohol abuse, hypertension, depression, anxiety, ADHD who came to ED with complaints of anxiety.  Patient was found to be confused in the ED.  She underwent CT head which showed crescentic hyperdense subdural hemorrhage extending across the cerebral hemispheres most pronounced over the left frontal and temporal lobes along the falx and left tentorium.  Mild mass-effect with global sulcal effacement and left right midline shift of 4 mm.  Neurosurgery saw the patient and recommended no invasive intervention.  Patient also was placed under IVC letter due to suicidal ideation.  As per significant other patient has history of suicidal ideation and has recently got older and has been practicing how to use it.  08/15/19:  No new issues this AM.  PT OT has recommended SNF.  TOC assisting with placement.  Assessment/Plan: Principal Problem:   Subdural hematoma (HCC) Active Problems:   Major depressive disorder, single episode, moderate degree (HCC)   Alcohol abuse   Hyponatremia   1. Subdural hematoma-patient seen by neurosurgery.  Continue Keppra.  No active neurological deficit.  No invasive intervention planned per neurosurgery. 2. Alcohol abuse/depression/suicidal attempt-patient was IVC by ER physician.  Seen by psych; ;outpatient follow up recommended.  Continue MV, thiamine, folic acid. 3. Hypovolemic Hyponatremia-sodium is 129, likely from chronic alcohol abuse.  Serum Na+ is trending up.  4. Transaminitis-Presented with elevated LFTs in setting of alcohol use.  AST and ALT have improved and continue to trend down. 5. Hypokalemia, replete with po KCL 40 meq x 2 doses 6. Hyperbilirubinemia, likely in the setting of acute illness, trending down 7. Ambulatory dysfunction in the setting of acute CVA, PT OT recs SNF.  TOC  assisting with placement.  DVT prophylaxis: SCDs  Code Status: Full code  Family Communication: No family at bedside  Disposition Plan:     Status is: Inpatient   Dispo: The patient is from: Home              Anticipated d/c is to: SNF              Anticipated d/c date is: 08/16/19               Patient currently awaiting SNF placement.       Objective: Vitals:   08/14/19 1928 08/15/19 0008 08/15/19 0421 08/15/19 0855  BP: (!) 151/96 128/79 128/82 123/71  Pulse: 88 79 83 80  Resp: 18 16 18 16   Temp: 98.5 F (36.9 C) 99.1 F (37.3 C) 99.1 F (37.3 C) 98.6 F (37 C)  TempSrc: Oral Oral Oral Oral  SpO2: 98% 97% 98% 97%    Intake/Output Summary (Last 24 hours) at 08/15/2019 1036 Last data filed at 08/15/2019 0912 Gross per 24 hour  Intake 270 ml  Output -  Net 270 ml   There were no vitals filed for this visit.  Exam:  . General: 69 y.o. year-old female Well developed well nourished in NAD . Cardiovascular: RRR no rubs or gallops . Respiratory:  CTA no wheezes or rales . Abdomen: soft NT ND NBS . Musculoskeletal: No LE edema . Psychiatry: Mood is appropriate  Data Reviewed: CBC: Recent Labs  Lab 2019/09/02 2216 08/15/19 0649  WBC 9.1 4.0  NEUTROABS 7.4 2.3  HGB 15.1* 12.4  HCT 41.1 34.8*  MCV 94.7 95.3  PLT 165 214   Basic  Metabolic Panel: Recent Labs  Lab 08/11/19 0516 08/11/19 1627 08/12/19 0354 08/13/19 0456 08/15/19 0649  NA 129* 128* 128* 129* 131*  K 3.0* 3.5 4.6 4.0 3.3*  CL 94* 93* 94* 96* 94*  CO2 21* 21* 19* 21* 26  GLUCOSE 105* 99 81 87 92  BUN 5* 7* 6* 5* <5*  CREATININE 0.47 0.61 0.54 0.55 0.50  CALCIUM 8.2* 8.7* 8.5* 8.8* 8.8*  MG  --  1.7  --   --   --   PHOS  --  2.4*  --   --   --    GFR: CrCl cannot be calculated (Unknown ideal weight.). Liver Function Tests: Recent Labs  Lab 2019/09/06 2216 08/13/19 0456 08/15/19 0649  AST 83* 36 40  ALT 173* 76* 62*  ALKPHOS 72 57 62  BILITOT 2.1* 1.5* 1.3*  PROT 7.1  5.7* 5.6*  ALBUMIN 4.4 3.1* 3.1*   Recent Labs  Lab 09-06-19 2216  LIPASE 27   Recent Labs  Lab 09-06-2019 2216  AMMONIA 25   Coagulation Profile: Recent Labs  Lab September 06, 2019 2216  INR 0.9   Cardiac Enzymes: No results for input(s): CKTOTAL, CKMB, CKMBINDEX, TROPONINI in the last 168 hours. BNP (last 3 results) No results for input(s): PROBNP in the last 8760 hours. HbA1C: No results for input(s): HGBA1C in the last 72 hours. CBG: No results for input(s): GLUCAP in the last 168 hours. Lipid Profile: No results for input(s): CHOL, HDL, LDLCALC, TRIG, CHOLHDL, LDLDIRECT in the last 72 hours. Thyroid Function Tests: Recent Labs    08/13/19 1634  TSH 1.620   Anemia Panel: No results for input(s): VITAMINB12, FOLATE, FERRITIN, TIBC, IRON, RETICCTPCT in the last 72 hours. Urine analysis:    Component Value Date/Time   COLORURINE YELLOW 08/14/2019 0407   APPEARANCEUR CLEAR 08/14/2019 0407   LABSPEC 1.012 08/14/2019 0407   PHURINE 6.0 08/14/2019 0407   GLUCOSEU NEGATIVE 08/14/2019 0407   HGBUR NEGATIVE 08/14/2019 0407   BILIRUBINUR NEGATIVE 08/14/2019 0407   BILIRUBINUR neg 09/25/2010 1503   KETONESUR 80 (A) 08/14/2019 0407   PROTEINUR NEGATIVE 08/14/2019 0407   UROBILINOGEN neg 09/25/2010 1503   NITRITE NEGATIVE 08/14/2019 0407   LEUKOCYTESUR NEGATIVE 08/14/2019 0407   Sepsis Labs: @LABRCNTIP (procalcitonin:4,lacticidven:4)  ) Recent Results (from the past 240 hour(s))  Respiratory Panel by RT PCR (Flu A&B, Covid) - Nasopharyngeal Swab     Status: None   Collection Time: 06-Sep-2019 10:16 PM   Specimen: Nasopharyngeal Swab  Result Value Ref Range Status   SARS Coronavirus 2 by RT PCR NEGATIVE NEGATIVE Final    Comment: (NOTE) SARS-CoV-2 target nucleic acids are NOT DETECTED. The SARS-CoV-2 RNA is generally detectable in upper respiratoy specimens during the acute phase of infection. The lowest concentration of SARS-CoV-2 viral copies this assay can detect is 131  copies/mL. A negative result does not preclude SARS-Cov-2 infection and should not be used as the sole basis for treatment or other patient management decisions. A negative result may occur with  improper specimen collection/handling, submission of specimen other than nasopharyngeal swab, presence of viral mutation(s) within the areas targeted by this assay, and inadequate number of viral copies (<131 copies/mL). A negative result must be combined with clinical observations, patient history, and epidemiological information. The expected result is Negative. Fact Sheet for Patients:  10/10/19 Fact Sheet for Healthcare Providers:  https://www.moore.com/ This test is not yet ap proved or cleared by the https://www.young.biz/ FDA and  has been authorized for detection and/or diagnosis of  SARS-CoV-2 by FDA under an Emergency Use Authorization (EUA). This EUA will remain  in effect (meaning this test can be used) for the duration of the COVID-19 declaration under Section 564(b)(1) of the Act, 21 U.S.C. section 360bbb-3(b)(1), unless the authorization is terminated or revoked sooner.    Influenza A by PCR NEGATIVE NEGATIVE Final   Influenza B by PCR NEGATIVE NEGATIVE Final    Comment: (NOTE) The Xpert Xpress SARS-CoV-2/FLU/RSV assay is intended as an aid in  the diagnosis of influenza from Nasopharyngeal swab specimens and  should not be used as a sole basis for treatment. Nasal washings and  aspirates are unacceptable for Xpert Xpress SARS-CoV-2/FLU/RSV  testing. Fact Sheet for Patients: PinkCheek.be Fact Sheet for Healthcare Providers: GravelBags.it This test is not yet approved or cleared by the [place] FDA and  has been authorized for detection and/or diagnosis of SARS-CoV-2 by  FDA under an Emergency Use Authorization (EUA). This EUA will remain  in effect (meaning this test can  be used) for the duration of the  Covid-19 declaration under Section 564(b)(1) of the Act, 21  U.S.C. section 360bbb-3(b)(1), unless the authorization is  terminated or revoked. Performed at [facility], [place] [address] [place], [place] [address]       Studies: No results found.  Scheduled Meds: .  stroke: mapping our early stages of recovery book   Does not apply Once  . folic acid  1 mg Oral Daily  . hydrALAZINE  10 mg Oral Q6H  . levETIRAcetam  500 mg Oral BID  . multivitamin with minerals  1 tablet Oral Daily  . pantoprazole  40 mg Oral QHS  . senna-docusate  1 tablet Oral BID  . thiamine  100 mg Oral Daily   Or  . thiamine  100 mg Intravenous Daily    Continuous Infusions:   LOS: 5 days     Kayleen Memos, MD [facility] Pager [phone]  If 7PM-7AM, please contact night-coverage [email or web address] Password [facility] 08/15/2019, 10:36 AM

## 2019-08-15 NOTE — Progress Notes (Signed)
Patient is refusing telemetry.  

## 2019-08-15 NOTE — Plan of Care (Signed)
  Problem: Health Behavior/Discharge Planning: Goal: Ability to manage health-related needs will improve Outcome: Progressing   Problem: Nutrition: Goal: Risk of aspiration will decrease Outcome: Progressing   Problem: Activity: Goal: Risk for activity intolerance will decrease Outcome: Progressing   Problem: Coping: Goal: Level of anxiety will decrease Outcome: Progressing   Problem: Safety: Goal: Ability to remain free from injury will improve Outcome: Progressing

## 2019-08-15 NOTE — Evaluation (Signed)
Speech Language Pathology Evaluation Patient Details Name: Olivia Harris MRN: [id number] DOB: 1950/10/03 Today's Date: 08/15/2019 Time: 1535-1600 SLP Time Calculation (min) (ACUTE ONLY): 25 min  Problem List:  Patient Active Problem List   Diagnosis Date Noted  . Alcohol abuse 08/11/2019  . Hyponatremia 08/11/2019  . Subdural hematoma (HCC) 08/11/2019  . Major depressive disorder, single episode, moderate degree (HCC) 12/04/2016  . Osteopenia, last dexa 07/2012, Ca Vit D Exercise, repeat 2 years 07/29/2012  . Essential hypertension, benign  07/29/2012  . Vaginal bleeding - followed by Dr. Nikki Harris ob/gyn 07/29/2012   Past Medical History:  Past Medical History:  Diagnosis Date  . ADHD (attention deficit hyperactivity disorder) Olivia Harris  . Anxiety Olivia Harris  . Arthritis   . Decreased libido   . Depression Olivia Harris  . Essential hypertension, benign  07/29/2012  . Onychomycosis   . Postmenopausal   . Scoliosis   . Scoliosis    degenerative   . Seborrheic dermatitis    Past Surgical History:  Past Surgical History:  Procedure Laterality Date  . arthroscopic surgery shoulder  right, 1994(Olivia Harris) bone spur removed   HPI:  Pt is a 69 y/o female admitted secondary to AMS and suicidal ideation. CT head subdural hemorrhage extending across the cerebral hemispheres most pronounced over the left frontal and temporal lobes and left right midline shift of 4 mm. PMH includes alcohol abuse, anxiety and scoliosis.   Assessment / Plan / Recommendation Clinical Impression  Olivia Harris demonstrates a severe cognitive impairment s/p recent SDH. Pt has poor insight into deficits, stating she's had no cognitive changes. 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 She has a master's degree in Social Work, and has a h/o suicidal ideation. Cognitive deficits include orientation (to day of week), short  term memory, divergent naming/thought organization, attention, visual processing, and processing speeds. At end of evaluation, pt did state, "I guess I'm foggier than I thought." She was educated on the need to have assistance with all IADLs at this time for her safety. She appeared agreeable to this and stated her friend Olivia Harris could help her. She scored an overall 8/30 on the [facility] Mental Status Examination with 27+ being WNL. She had severe L inattention with all numbers being written on R side of clock and noted difficulty finding things on L in her environment. Pt is recommended for acute cognitive therapy and is expected to benefit from cognitive therapy after discharge in SNF. Full assist is needed for any high level cognitive tasks.    SLP Assessment  SLP Recommendation/Assessment: Patient needs continued Speech Lanaguage Pathology Services SLP Visit Diagnosis: Cognitive communication deficit (R41.841)    Follow Up Recommendations  Skilled Nursing facility    Frequency and Duration min 2x/week  1 week      SLP Evaluation Cognition  Overall Cognitive Status: Impaired/Different from baseline Arousal/Alertness: Awake/alert Orientation Level: Oriented to person;Oriented to place;Oriented to situation;Disoriented to time Attention: Focused Focused Attention: Impaired Focused Attention Impairment: Verbal basic;Verbal complex;Functional basic;Functional complex Memory: Impaired Memory Impairment: Storage deficit;Retrieval deficit;Decreased short term memory Decreased Short Term Memory: Verbal basic;Verbal complex;Functional basic;Functional complex Awareness: Impaired Problem Solving: Impaired Problem Solving Impairment: Verbal basic;Verbal complex Executive Function: Self Monitoring;Reasoning;Decision Making Reasoning: Impaired Reasoning Impairment: Verbal complex;Functional complex Sequencing: Impaired Sequencing Impairment: Verbal complex;Functional complex Decision  Making: Impaired Decision Making Impairment: Verbal complex;Functional complex Self Monitoring: Impaired Self Monitoring Impairment: Verbal complex;Functional complex Safety/Judgment: Impaired [place] [employer] Scales  of Cognitive Functioning: Purposeful/appropriate       Oral / Motor  Oral Motor/Sensory Function Overall Oral Motor/Sensory Function: Within functional limits Motor Speech Overall Motor Speech: Appears within functional limits for tasks assessed Respiration: Within functional limits Phonation: Normal Resonance: Within functional limits Articulation: Within functional limitis Intelligibility: Intelligible Motor Planning: Witnin functional limits Motor Speech Errors: Not applicable   Olivia Harris P. Olivia Harris, M.S., CCC-SLP Speech-Language Pathologist Acute Rehabilitation Services Pager: [place] 08/15/2019, 4:08 PM

## 2019-08-15 NOTE — Progress Notes (Signed)
Olivia Harris Nov 10, 1950 08/15/2019  To Whom It May Concern:  Please be advised that the above-named patient will require a short-term nursing home stay-anticipated 30 days or less for rehabilitation and strengthening. The plan is for return home.

## 2019-08-15 NOTE — NC FL2 (Signed)
[place] MEDICAID FL2 LEVEL OF CARE SCREENING TOOL     IDENTIFICATION  Patient Name: Olivia Harris Birthdate: 02-27-1951 Sex: female Admission Date (Current Location): 09/04/2019  [facility] and [place] Number:  Producer, television/film/video and Address:  [facility], [address], [place], [place] [address]      Provider Number: [id number]  Attending Physician Name and Address:  Darlin Drop, DO  Relative Name and Phone Number:       Current Level of Care: Hospital Recommended Level of Care: Skilled Nursing Facility Prior Approval Number:    Date Approved/Denied:   PASRR Number: Manual review  Discharge Plan: SNF    Current Diagnoses: Patient Active Problem List   Diagnosis Date Noted  . Alcohol abuse 08/11/2019  . Hyponatremia 08/11/2019  . Subdural hematoma (HCC) 2019-09-04  . Major depressive disorder, single episode, moderate degree (HCC) 12/04/2016  . Osteopenia, last dexa 07/2012, Ca Vit D Exercise, repeat 2 years 07/29/2012  . Essential hypertension, benign  07/29/2012  . Vaginal bleeding - followed by Dr. Nikki Dom ob/gyn 07/29/2012    Orientation RESPIRATION BLADDER Height & Weight     Self, Time, Situation, Place(memory impairment)  Normal Continent Weight:   Height:     BEHAVIORAL SYMPTOMS/MOOD NEUROLOGICAL BOWEL NUTRITION STATUS    Convulsions/Seizures(keppra 500 mg PO BID) Continent Diet(Regular with thin liquids)  AMBULATORY STATUS COMMUNICATION OF NEEDS Skin   Limited Assist Verbally Normal                       Personal Care Assistance Level of Assistance  Bathing, Feeding, Dressing Bathing Assistance: Limited assistance Feeding assistance: Independent Dressing Assistance: Limited assistance     Functional Limitations Info  Sight, Hearing, Speech Sight Info: Adequate Hearing Info: Adequate Speech Info: Adequate    SPECIAL CARE FACTORS FREQUENCY  PT (By licensed PT), OT (By licensed OT), Speech therapy      PT Frequency: 5x/wk OT Frequency: 5x/wk     Speech Therapy Frequency: 3x/wk      Contractures Contractures Info: Not present    Additional Factors Info  Code Status, Allergies Code Status Info: Full Allergies Info: Antihistamine/ chlorpheniramine-type           Current Medications (08/15/2019):  This is the current hospital active medication list Current Facility-Administered Medications  Medication Dose Route Frequency Provider Last Rate Last Admin  .  stroke: mapping our early stages of recovery book   Does not apply Once Lewie Chamber, MD   Stopped at 08/11/19 0810  . acetaminophen (TYLENOL) tablet 650 mg  650 mg Oral Q4H PRN Lewie Chamber, MD       Or  . acetaminophen (TYLENOL) 160 MG/5ML solution 650 mg  650 mg Per Tube Q4H PRN Lewie Chamber, MD       Or  . acetaminophen (TYLENOL) suppository 650 mg  650 mg Rectal Q4H PRN Lewie Chamber, MD      . folic acid (FOLVITE) tablet 1 mg  1 mg Oral Daily Meredeth Ide, MD   1 mg at 08/15/19 8338  . hydrALAZINE (APRESOLINE) tablet 10 mg  10 mg Oral Q6H Dorcas Carrow, MD   10 mg at 08/15/19 1222  . labetalol (NORMODYNE) injection 10 mg  10 mg Intravenous Q2H PRN Dorcas Carrow, MD      . levETIRAcetam (KEPPRA) tablet 500 mg  500 mg Oral BID Coletta Memos, MD   500 mg at 08/15/19 0927  . multivitamin with  minerals tablet 1 tablet  1 tablet Oral Daily Dwyane Dee, MD   1 tablet at 08/15/19 [phone]  . ondansetron (ZOFRAN) tablet 4 mg  4 mg Oral Q8H PRN Dwyane Dee, MD   4 mg at 08/14/19 1748  . pantoprazole (PROTONIX) EC tablet 40 mg  40 mg Oral QHS Oswald Hillock, MD   40 mg at 08/14/19 2205  . senna-docusate (Senokot-S) tablet 1 tablet  1 tablet Oral BID Dwyane Dee, MD   1 tablet at 08/15/19 [phone]  . thiamine tablet 100 mg  100 mg Oral Daily Dwyane Dee, MD   100 mg at 08/15/19 1031   Or  . thiamine (B-1) injection 100 mg  100 mg Intravenous Daily Dwyane Dee, MD   100 mg at 2019/09/01 2250     Discharge  Medications: Please see discharge summary for a list of discharge medications.  Relevant Imaging Results:  Relevant Lab Results:   Additional Information SS#: [id number]  Pollie Friar, RN

## 2019-08-15 NOTE — Progress Notes (Signed)
Patient ID: Olivia Harris, female   DOB: 1950-11-18, 69 y.o.   MRN: [id number] BP 133/76 (BP Location: Right Arm)   Pulse 89   Temp 99.9 F (37.7 C) (Oral)   Resp 18   SpO2 98%  Alert, oriented x 4 Speech is clear, fluent Moving all extremities Awaiting placement. No contraindications for discharge. Will follow up after discharge from snf.

## 2019-08-16 LAB — URINE CULTURE: Culture: <10000 — AB

## 2019-08-16 LAB — URINALYSIS, ROUTINE W REFLEX MICROSCOPIC
Bilirubin Urine: NEGATIVE
Glucose, UA: NEGATIVE mg/dL
Hgb urine dipstick: NEGATIVE
Ketones, ur: 20 mg/dL — AB
Leukocytes,Ua: NEGATIVE
Nitrite: NEGATIVE
Protein, ur: NEGATIVE mg/dL
Specific Gravity, Urine: 1.014 (ref 1.005–1.030)
pH: 6 (ref 5.0–8.0)

## 2019-08-16 LAB — SARS CORONAVIRUS 2 (TAT 6-24 HRS): SARS Coronavirus 2: NEGATIVE

## 2019-08-16 LAB — POTASSIUM: Potassium: 3.5 mmol/L (ref 3.5–5.1)

## 2019-08-16 MED ORDER — PANTOPRAZOLE SODIUM 40 MG PO TBEC: 40.0000 mg | DELAYED_RELEASE_TABLET | Freq: Every day | ORAL | 0 refills | Status: AC

## 2019-08-16 MED ORDER — ADULT MULTIVITAMIN W/MINERALS CH: 1.0000 | ORAL_TABLET | Freq: Every day | ORAL | 0 refills | Status: AC

## 2019-08-16 MED ORDER — THIAMINE HCL 100 MG PO TABS: 100.0000 mg | ORAL_TABLET | Freq: Every day | ORAL | 0 refills | Status: AC

## 2019-08-16 MED ORDER — STROKE: EARLY STAGES OF RECOVERY BOOK: 1.0000 | Freq: Once | 0 refills | Status: AC

## 2019-08-16 MED ORDER — LEVETIRACETAM 500 MG PO TABS: 500.0000 mg | ORAL_TABLET | Freq: Two times a day (BID) | ORAL | 0 refills | Status: DC

## 2019-08-16 MED ORDER — HYDRALAZINE HCL 10 MG PO TABS: 10.0000 mg | ORAL_TABLET | Freq: Four times a day (QID) | ORAL | 0 refills | Status: AC

## 2019-08-16 MED ORDER — FOLIC ACID 1 MG PO TABS: 1.0000 mg | ORAL_TABLET | Freq: Every day | ORAL | 0 refills | Status: AC

## 2019-08-16 MED ORDER — LEVETIRACETAM 500 MG PO TABS: 500.0000 mg | ORAL_TABLET | Freq: Two times a day (BID) | ORAL | 0 refills | Status: AC

## 2019-08-16 NOTE — Plan of Care (Signed)
  Problem: Self-Care: Goal: Ability to participate in self-care as condition permits will improve Outcome: Progressing Goal: Verbalization of feelings and concerns over difficulty with self-care will improve Outcome: Progressing   Problem: Nutrition: Goal: Risk of aspiration will decrease Outcome: Progressing   Problem: Coping: Goal: Will verbalize positive feelings about self Outcome: Not Progressing

## 2019-08-16 NOTE — TOC Progression Note (Addendum)
Transition of Care [facility]) - Progression Note    Patient Details  Name: Olivia Harris MRN: [id number] Date of Birth: 1950/09/18  Transition of Care [facility]) CM/SW [place], [place] Work Phone Number: 08/16/2019, 10:59 AM  Clinical Narrative:    MSW Intern met with pt to discuss her options for SNF placement. She asked that her friend/ contact, Barnabas Lister, be called for the decision. Barnabas Lister was contacted and he said they chose [facility]. [facility] was contacted, MSW Intern spoke with [place]. She said they could take pt tomorrow. Pt has not had covid vaccines. SW will continue to follow for discharge.  Addended 11:32 5/12 [facility] requested pt's psych clearance note, which was faxed. They are now saying they are looking to see if they can take her and will follow up with SW. SW will continue to follow.  Expected Discharge Plan: [place] Barriers to Discharge: Continued Medical Work up  Expected Discharge Plan and Services Expected Discharge Plan: [place] In-house Referral: Clinical Social Work Discharge Planning Services: CM Consult Post Acute Care Choice: [place] arrangements for the past 2 months: Single Family Home                                       Social Determinants of Health (SDOH) Interventions    Readmission Risk Interventions No flowsheet data found.

## 2019-08-16 NOTE — Discharge Instructions (Signed)
Alcohol Use Disorder °Alcohol use disorder is when your drinking disrupts your daily life. When you have this condition, you drink too much alcohol and you cannot control your drinking. °Alcohol use disorder can cause serious problems with your physical health. It can affect your brain, heart, liver, pancreas, immune system, stomach, and intestines. Alcohol use disorder can increase your risk for certain cancers and cause problems with your mental health, such as depression, anxiety, psychosis, delirium, and dementia. People with this disorder risk hurting themselves and others. °What are the causes? °This condition is caused by drinking too much alcohol over time. It is not caused by drinking too much alcohol only one or two times. Some people with this condition drink alcohol to cope with or escape from negative life events. Others drink to relieve pain or symptoms of mental illness. °What increases the risk? °You are more likely to develop this condition if: °· You have a family history of alcohol use disorder. °· Your culture encourages drinking to the point of intoxication, or makes alcohol easy to get. °· You had a mood or conduct disorder in childhood. °· You have been a victim of abuse. °· You are an adolescent and: °? You have poor grades or difficulties in school. °? Your caregivers do not talk to you about saying no to alcohol, or supervise your activities. °? You are impulsive or you have trouble with self-control. °What are the signs or symptoms? °Symptoms of this condition include: °· Drinking more than you want to. °· Drinking for longer than you want to. °· Trying several times to drink less or to control your drinking. °· Spending a lot of time getting alcohol, drinking, or recovering from drinking. °· Craving alcohol. °· Having problems at work, at school, or at home due to drinking. °· Having problems in relationships due to drinking. °· Drinking when it is dangerous to drink, such as before  driving a car. °· Continuing to drink even though you know you might have a physical or mental problem related to drinking. °· Needing more and more alcohol to get the same effect you want from the alcohol (building up tolerance). °· Having symptoms of withdrawal when you stop drinking. Symptoms of withdrawal include: °? Fatigue. °? Nightmares. °? Trouble sleeping. °? Depression. °? Anxiety. °? Fever. °? Seizures. °? Severe confusion. °? Feeling or seeing things that are not there (hallucinations). °? Tremors. °? Rapid heart rate. °? Rapid breathing. °? High blood pressure. °· Drinking to avoid symptoms of withdrawal. °How is this diagnosed? °This condition is diagnosed with an assessment. Your health care provider may start the assessment by asking three or four questions about your drinking. °Your health care provider may perform a physical exam or do lab tests to see if you have physical problems resulting from alcohol use. She or he may refer you to a mental health professional for evaluation. °How is this treated? °Some people with alcohol use disorder are able to reduce their alcohol use to low-risk levels. Others need to completely quit drinking alcohol. When necessary, mental health professionals with specialized training in substance use treatment can help. Your health care provider can help you decide how severe your alcohol use disorder is and what type of treatment you need. The following forms of treatment are available: °· Detoxification. Detoxification involves quitting drinking and using prescription medicines within the first week to help lessen withdrawal symptoms. This treatment is important for people who have had withdrawal symptoms before and for heavy drinkers   who are likely to have withdrawal symptoms. Alcohol withdrawal can be dangerous, and in severe cases, it can cause death. Detoxification may be provided in a home, community, or primary care setting, or in a hospital or substance use  treatment facility. °· Counseling. This treatment is also called talk therapy. It is provided by substance use treatment counselors. A counselor can address the reasons you use alcohol and suggest ways to keep you from drinking again or to prevent problem drinking. The goals of talk therapy are to: °? Find healthy activities and ways for you to cope with stress. °? Identify and avoid the things that trigger your alcohol use. °? Help you learn how to handle cravings. °· Medicines. Medicines can help treat alcohol use disorder by: °? Decreasing alcohol cravings. °? Decreasing the positive feeling you have when you drink alcohol. °? Causing an uncomfortable physical reaction when you drink alcohol (aversion therapy). °· Support groups. Support groups are led by people who have quit drinking. They provide emotional support, advice, and guidance. °These forms of treatment are often combined. Some people with this condition benefit from a combination of treatments provided by specialized substance use treatment centers. °Follow these instructions at home: °· Take over-the-counter and prescription medicines only as told by your health care provider. °· Check with your health care provider before starting any new medicines. °· Ask friends and family members not to offer you alcohol. °· Avoid situations where alcohol is served, including gatherings where others are drinking alcohol. °· Create a plan for what to do when you are tempted to use alcohol. °· Find hobbies or activities that you enjoy that do not include alcohol. °· Keep all follow-up visits as told by your health care provider. This is important. °How is this prevented? °· If you drink, limit alcohol intake to no more than 1 drink a day for nonpregnant women and 2 drinks a day for men. One drink equals 12 oz of beer, 5 oz of wine, or 1½ oz of hard liquor. °· If you have a mental health condition, get treatment and support. °· Do not give alcohol to  adolescents. °· If you are an adolescent: °? Do not drink alcohol. °? Do not be afraid to say no if someone offers you alcohol. Speak up about why you do not want to drink. You can be a positive role model for your friends and set a good example for those around you by not drinking alcohol. °? If your friends drink, spend time with others who do not drink alcohol. Make new friends who do not use alcohol. °? Find healthy ways to manage stress and emotions, such as meditation or deep breathing, exercise, spending time in nature, listening to music, or talking with a trusted friend or family member. °Contact a health care provider if: °· You are not able to take your medicines as told. °· Your symptoms get worse. °· You return to drinking alcohol (relapse) and your symptoms get worse. °Get help right away if: °· You have thoughts about hurting yourself or others. °If you ever feel like you may hurt yourself or others, or have thoughts about taking your own life, get help right away. You can go to your nearest emergency department or call: °· Your local emergency services (911 in the [place] °· A suicide crisis helpline, such as the National Suicide Prevention Lifeline at 1-800-273-8255. This is open 24 hours a day. °Summary °· Alcohol use disorder is when your drinking disrupts your daily   life. When you have this condition, you drink too much alcohol and you cannot control your drinking. °· Treatment may include detoxification, counseling, medicine, and support groups. °· Ask friends and family members not to offer you alcohol. Avoid situations where alcohol is served. °· Get help right away if you have thoughts about hurting yourself or others. °This information is not intended to replace advice given to you by your health care provider. Make sure you discuss any questions you have with your health care provider. °Document Revised: 03/05/2017 Document Reviewed: 12/19/2015 °Elsevier Patient Education © 2020 [employer]  Inc. ° °

## 2019-08-16 NOTE — Progress Notes (Signed)
Pt was slightly confused and voiding frequently over the night MD made aware  UA /urine C/s  UA result negative. Pt a high fall risk. Education given.     Floor mat and  otherr fall precaution measures. maintain. May need low bed if continue to be impulsive.

## 2019-08-16 NOTE — Discharge Summary (Addendum)
Discharge Summary  Olivia Harris BTC:[id number] DOB: 03/21/1951  PCP: Patient, No Pcp Per  Admit date: 08/13/2019 Discharge date: 08/16/2019  Time spent: 35 minutes  Recommendations for Outpatient Follow-up:  1. Follow up with neurosurgery in 1-2 weeks 2. Follow-up with psychiatry in 1 to 2 weeks 3. Follow up with your PCP in 1 week 4. Take your medications as prescribed 5. Continue PT OT with assistance and fall precautions.  Discharge Diagnoses:  Active Hospital Problems   Diagnosis Date Noted  . Subdural hematoma (HCC) 08/14/2019  . Alcohol abuse 08/11/2019  . Hyponatremia 08/11/2019  . Major depressive disorder, single episode, moderate degree (HCC) 12/04/2016    Resolved Hospital Problems  No resolved problems to display.    Discharge Condition: Stable  Diet recommendation: Resume previous diet.  Vitals:   08/16/19 1202 08/16/19 1540  BP: (!) 144/98 140/87  Pulse: 85 87  Resp: 18 18  Temp: 98.8 F (37.1 C) 98.3 F (36.8 C)  SpO2: 97% 98%    History of present illness:  69 year old female with a history of alcohol abuse, hypertension, depression, anxiety, ADHD who came to ED with complaints of anxiety. Patient was found to be confused in the ED. She underwent CT head which showed crescentic hyperdense subdural hemorrhage extending across the cerebral hemispheres most pronounced over the left frontal and temporal lobes along the falx and left tentorium. Mild mass-effect with global sulcal effacement and left right midline shift of 4 mm. Neurosurgery saw the patient and recommended no invasive intervention. Patient also was placed under IVC letter due to suicidal ideation.  Seen by psych. She denies current suicidal ideation.  Recommended outpatient follow-up.  08/16/19: No acute events overnight.  No new complaints.  Hospital Course:  Principal Problem:   Subdural hematoma (HCC) Active Problems:   Major depressive disorder, single episode, moderate  degree (HCC)   Alcohol abuse   Hyponatremia  Subdural hematoma, nonoperative per neurosurgery- Seen by neurosurgery. No invasive intervention planned per neurosurgery. No reported seizures activity Complete seizures prophylaxis Keppra 500 mg twice daily x 7 days, day# 6/7.  Follow up with neurosurgery Dr. Franky Macho outpatient.  Alcohol abuse/depression/suicidal attempt- Patient was IVC by ER physician.  Seen by psych; outpatient follow up recommended.   Continue p.o. MV, thiamine, folic acid.  Resolving hypovolemic Hyponatremia Serum sodium 131.  Encourage increase oral intake Avoid use of alcohol  Transaminitis- Presented with elevated LFTs in setting of alcohol use. AST, ALT and T bili have improved and continue to trend down.  Resolved post repletion: Hypokalemia Serum potassium 3.5 Follow-up with your PCP  Ambulatory dysfunction  In the setting of acute CVA PT OT recs SNF.   TOC assisting with placement.   Code Status:Full code   Consult: Neurosurgery, psych.   Discharge Exam: BP 140/87 (BP Location: Left Arm)   Pulse 87   Temp 98.3 F (36.8 C) (Oral)   Resp 18   SpO2 98%  . General: 69 y.o. year-old female well developed well nourished in no acute distress.  Alert and cooperative. . Cardiovascular: Regular rate and rhythm with no rubs or gallops.  No thyromegaly or JVD noted.   [name] Respiratory: Clear to auscultation with no wheezes or rales. Good inspiratory effort. . Abdomen: Soft nontender nondistended with normal bowel sounds x4 quadrants. . Musculoskeletal: No lower extremity edema. 2/4 pulses in all 4 extremities. [name] Psychiatry: Mood is appropriate for condition and setting  Discharge Instructions You were cared for by a hospitalist during your hospital stay.  If you have any questions about your discharge medications or the care you received while you were in the hospital after you are discharged, you can call the unit and asked to speak with the  hospitalist on call if the hospitalist that took care of you is not available. Once you are discharged, your primary care physician will handle any further medical issues. Please note that NO REFILLS for any discharge medications will be authorized once you are discharged, as it is imperative that you return to your primary care physician (or establish a relationship with a primary care physician if you do not have one) for your aftercare needs so that they can reassess your need for medications and monitor your lab values.   Allergies as of 08/16/2019      Reactions   Antihistamines, Chlorpheniramine-type Other (See Comments)   hyperactivity      Medication List    STOP taking these medications   lisinopril 5 MG tablet Commonly known as: ZESTRIL     TAKE these medications    stroke: mapping our early stages of recovery book Misc 1 each by Does not apply route once for 1 dose.   folic acid 1 MG tablet Commonly known as: FOLVITE Take 1 tablet (1 mg total) by mouth daily. Start taking on: 09-10-19   hydrALAZINE 10 MG tablet Commonly known as: APRESOLINE Take 1 tablet (10 mg total) by mouth 4 (four) times daily.   levETIRAcetam 500 MG tablet Commonly known as: KEPPRA Take 1 tablet (500 mg total) by mouth 2 (two) times daily for 1 day.   multivitamin with minerals Tabs tablet Take 1 tablet by mouth daily. Start taking on: 09-10-19   pantoprazole 40 MG tablet Commonly known as: PROTONIX Take 1 tablet (40 mg total) by mouth at bedtime.   thiamine 100 MG tablet Take 1 tablet (100 mg total) by mouth daily. Start taking on: 10-Sep-2019      Allergies  Allergen Reactions  . Antihistamines, Chlorpheniramine-Type Other (See Comments)    hyperactivity   Follow-up Information    [facility]. Call in 1 day(s).   Why: Call for a post hospital follow-up appointment. Contact information: [address] [place]  [address] [phone]       Micki Riley, MD. Call in 1 day(s).   Specialties: Neurology, Radiology Why: Please call for a post hospital follow-up appointment. Contact information: [address] [place] [address] [phone]        Coletta Memos, MD. Call in 1 day(s).   Specialty: Neurosurgery Why: Please call for a post hospital follow-up appointment. Contact information: [address] [place] [address] [phone]        Maryagnes Amos, FNP. Call in 1 day(s).   Specialties: Nurse Practitioner, Psychiatry Why: Please call for a post hospital follow-up appointment. Contact information: [address] [place] [address] [phone]            The results of significant diagnostics from this hospitalization (including imaging, microbiology, ancillary and laboratory) are listed below for reference.    Significant Diagnostic Studies: CT HEAD WO CONTRAST  Result Date: 08/11/2019 CLINICAL DATA:  Stroke follow-up EXAM: CT HEAD WITHOUT CONTRAST TECHNIQUE: Contiguous axial images were obtained from the base of the skull through the vertex without intravenous contrast. COMPARISON:  Yesterday FINDINGS: Brain: High-density subdural hematoma along the left cerebral convexity measuring up  to 10 mm in thickness. There is cortical mass effect and sulcal effacement-midline shift is unchanged at 4 mm. Negative for infarct or hydrocephalus. Mild white matter disease and volume loss. Vascular: Atherosclerotic calcification Skull: Normal. Negative for fracture or focal lesion. Sinuses/Orbits: No acute finding. IMPRESSION: Unchanged subdural hematoma on the left measuring up to 1 cm in thickness. Midline shift is 4 mm. Electronically Signed   By: Monte Fantasia M.D.   On: 08/11/2019 16:01   CT Head Wo Contrast  Result Date: 08/08/2019 CLINICAL DATA:  Confusion, altered mental status EXAM: CT HEAD WITHOUT CONTRAST  TECHNIQUE: Contiguous axial images were obtained from the base of the skull through the vertex without intravenous contrast. COMPARISON:  None. FINDINGS: Brain: Crescentic, hyperdense subdural hemorrhage seen extending across the left frontal and parietal convexities with additional hyperdense hemorrhage seen layering along the left tentorium and along the left falx as well. There is fairly global sulcal effacement of the left cerebral hemisphere with some a minimal left-to-right midline shift approximately 4 mm best seen on coronal imaging 5/22. No other sites of hemorrhage. No CT evidence of large vascular territory infarct. Patchy areas of white matter hypoattenuation are most compatible with chronic microvascular angiopathy. Symmetric prominence of the ventricles, cisterns and sulci compatible with parenchymal volume loss. Vascular: No hyperdense vessel. Skull: No calvarial fracture or suspicious osseous lesion. No scalp swelling or hematoma. Sinuses/Orbits: Paranasal sinuses and mastoid air cells are predominantly clear. Pneumatization of the petrous apices. Included orbital structures are unremarkable. Other: None IMPRESSION: 1. Crescentic, hyperdense subdural hemorrhage extending across the cerebral hemisphere most pronounced over the left frontal and temporal lobes, along the falx and left tentorium. 2. Mild mass effect with global sulcal effacement and left-to-right midline shift of 4 mm. 3. No CT evidence of large vascular territory infarct. 4. Chronic microvascular angiopathy and parenchymal volume loss. Critical Value/emergent results were called by telephone at the time of interpretation on 08/24/2019 at 9:55 pm to provider [facility] , who verbally acknowledged these results. Electronically Signed   By: Lovena Le M.D.   On: 08/18/2019 21:55    Microbiology: Recent Results (from the past 240 hour(s))  Respiratory Panel by RT PCR (Flu A&B, Covid) - Nasopharyngeal Swab     Status: None    Collection Time: 08/28/2019 10:16 PM   Specimen: Nasopharyngeal Swab  Result Value Ref Range Status   SARS Coronavirus 2 by RT PCR NEGATIVE NEGATIVE Final    Comment: (NOTE) SARS-CoV-2 target nucleic acids are NOT DETECTED. The SARS-CoV-2 RNA is generally detectable in upper respiratoy specimens during the acute phase of infection. The lowest concentration of SARS-CoV-2 viral copies this assay can detect is 131 copies/mL. A negative result does not preclude SARS-Cov-2 infection and should not be used as the sole basis for treatment or other patient management decisions. A negative result may occur with  improper specimen collection/handling, submission of specimen other than nasopharyngeal swab, presence of viral mutation(s) within the areas targeted by this assay, and inadequate number of viral copies (<131 copies/mL). A negative result must be combined with clinical observations, patient history, and epidemiological information. The expected result is Negative. Fact Sheet for Patients:  PinkCheek.be Fact Sheet for Healthcare Providers:  GravelBags.it This test is not yet ap proved or cleared by the [place] FDA and  has been authorized for detection and/or diagnosis of SARS-CoV-2 by FDA under an Emergency Use Authorization (EUA). This EUA will remain  in effect (meaning this test can be used) for the  duration of the COVID-19 declaration under Section 564(b)(1) of the Act, 21 U.S.C. section 360bbb-3(b)(1), unless the authorization is terminated or revoked sooner.    Influenza A by PCR NEGATIVE NEGATIVE Final   Influenza B by PCR NEGATIVE NEGATIVE Final    Comment: (NOTE) The Xpert Xpress SARS-CoV-2/FLU/RSV assay is intended as an aid in  the diagnosis of influenza from Nasopharyngeal swab specimens and  should not be used as a sole basis for treatment. Nasal washings and  aspirates are unacceptable for Xpert Xpress  SARS-CoV-2/FLU/RSV  testing. Fact Sheet for Patients: https://www.moore.com/ Fact Sheet for Healthcare Providers: https://www.young.biz/ This test is not yet approved or cleared by the [place] FDA and  has been authorized for detection and/or diagnosis of SARS-CoV-2 by  FDA under an Emergency Use Authorization (EUA). This EUA will remain  in effect (meaning this test can be used) for the duration of the  Covid-19 declaration under Section 564(b)(1) of the Act, 21  U.S.C. section 360bbb-3(b)(1), unless the authorization is  terminated or revoked. Performed at [facility], [address] [place], [place] [address]      Labs: Basic Metabolic Panel: Recent Labs  Lab 08/11/19 0516 08/11/19 0516 08/11/19 1627 08/12/19 0354 08/13/19 0456 08/15/19 0649 08/16/19 0737  NA 129*  --  128* 128* 129* 131*  --   K 3.0*   < > 3.5 4.6 4.0 3.3* 3.5  CL 94*  --  93* 94* 96* 94*  --   CO2 21*  --  21* 19* 21* 26  --   GLUCOSE 105*  --  99 81 87 92  --   BUN 5*  --  7* 6* 5* <5*  --   CREATININE 0.47  --  0.61 0.54 0.55 0.50  --   CALCIUM 8.2*  --  8.7* 8.5* 8.8* 8.8*  --   MG  --   --  1.7  --   --   --   --   PHOS  --   --  2.4*  --   --   --   --    < > = values in this interval not displayed.   Liver Function Tests: Recent Labs  Lab 08/12/2019 2216 08/13/19 0456 08/15/19 0649  AST 83* 36 40  ALT 173* 76* 62*  ALKPHOS 72 57 62  BILITOT 2.1* 1.5* 1.3*  PROT 7.1 5.7* 5.6*  ALBUMIN 4.4 3.1* 3.1*   Recent Labs  Lab 08/05/2019 2216  LIPASE 27   Recent Labs  Lab 08/19/2019 2216  AMMONIA 25   CBC: Recent Labs  Lab 08/27/2019 2216 08/15/19 0649  WBC 9.1 4.0  NEUTROABS 7.4 2.3  HGB 15.1* 12.4  HCT 41.1 34.8*  MCV 94.7 95.3  PLT 165 214   Cardiac Enzymes: No results for input(s): CKTOTAL, CKMB, CKMBINDEX, TROPONINI in the last 168 hours. BNP: BNP (last 3 results) No results for input(s): BNP in the last 8760  hours.  ProBNP (last 3 results) No results for input(s): PROBNP in the last 8760 hours.  CBG: No results for input(s): GLUCAP in the last 168 hours.     Signed:  Darlin Drop, MD [facility] 08/16/2019, 5:40 PM

## 2019-08-16 NOTE — Progress Notes (Signed)
PROGRESS NOTE  Olivia Harris OVA:[id number] DOB: [birth year] DOA: 08/05/2019 PCP: Patient, No Pcp Per  HPI/Recap of past 48 hours: 69 year old female with a history of alcohol abuse, hypertension, depression, anxiety, ADHD who came to ED with complaints of anxiety.  Patient was found to be confused in the ED.  She underwent CT head which showed crescentic hyperdense subdural hemorrhage extending across the cerebral hemispheres most pronounced over the left frontal and temporal lobes along the falx and left tentorium.  Mild mass-effect with global sulcal effacement and left right midline shift of 4 mm.  Neurosurgery saw the patient and recommended no invasive intervention.  Patient also was placed under IVC letter due to suicidal ideation.  Seen by psych. She denies current suicidal ideation.  Recommended outpatient follow-up.   08/16/19: No acute events overnight.  No new complaints.   Assessment/Plan: Principal Problem:   Subdural hematoma (HCC) Active Problems:   Major depressive disorder, single episode, moderate degree (HCC)   Alcohol abuse   Hyponatremia   1. Subdural hematoma-patient seen by neurosurgery.  Continue Keppra.  No active neurological deficit.  No invasive intervention planned per neurosurgery.  Will follow up with neurosurgery outpatient. 2. Alcohol abuse/depression/suicidal attempt-patient was IVC by ER physician.  Seen by psych; ;outpatient follow up recommended.  Continue p.o. MV, thiamine, folic acid. 3. Hypovolemic Hyponatremia-serum sodium 131.  4. Transaminitis-Presented with elevated LFTs in setting of alcohol use.  AST and ALT have improved and continue to trend down. 5. Resolved post repletion: Hypokalemia, serum potassium 3.5 6. Hyperbilirubinemia, likely in the setting of acute illness, trending down 7. Ambulatory dysfunction in the setting of acute CVA, PT OT recs SNF.  TOC assisting with placement.  DVT prophylaxis: SCDs  Code Status: Full  code  Family Communication: No family at bedside  Disposition Plan:   Consult: Neurosurgery, psych.    Status is: Inpatient   Dispo: The patient is from: Home              Anticipated d/c is to: SNF              Anticipated d/c date is: 08/16/19 or 08/24/2019 pending bed placement              Patient currently awaiting SNF placement.       Objective: Vitals:   08/15/19 2328 08/16/19 0429 08/16/19 0807 08/16/19 1202  BP: 133/78 126/78 (!) 141/84 (!) 144/98  Pulse: 85 81 76 85  Resp: 19 18 18 18   Temp: 97.9 F (36.6 C) 98.4 F (36.9 C) 98.4 F (36.9 C) 98.8 F (37.1 C)  TempSrc: Oral Oral Oral Oral  SpO2: 97% 99% 97% 97%    Intake/Output Summary (Last 24 hours) at 08/16/2019 1345 Last data filed at 08/16/2019 1100 Gross per 24 hour  Intake 630 ml  Output 650 ml  Net -20 ml   There were no vitals filed for this visit.  Exam: No significant change from prior exam.  . General: 69 y.o. year-old female Well developed well nourished in NAD . Cardiovascular: RRR no rubs or gallops . Respiratory:  CTA no wheezes or rales . Abdomen: soft NT ND NBS . Musculoskeletal: No LE edema . Psychiatry: Mood is appropriate  Data Reviewed: CBC: Recent Labs  Lab 08/20/2019 2216 08/15/19 0649  WBC 9.1 4.0  NEUTROABS 7.4 2.3  HGB 15.1* 12.4  HCT 41.1 34.8*  MCV 94.7 95.3  PLT 165 214   Basic Metabolic Panel: Recent Labs  Lab 08/11/19 0516  08/11/19 0516 08/11/19 1627 08/12/19 0354 08/13/19 0456 08/15/19 0649 08/16/19 0737  NA 129*  --  128* 128* 129* 131*  --   K 3.0*   < > 3.5 4.6 4.0 3.3* 3.5  CL 94*  --  93* 94* 96* 94*  --   CO2 21*  --  21* 19* 21* 26  --   GLUCOSE 105*  --  99 81 87 92  --   BUN 5*  --  7* 6* 5* <5*  --   CREATININE 0.47  --  0.61 0.54 0.55 0.50  --   CALCIUM 8.2*  --  8.7* 8.5* 8.8* 8.8*  --   MG  --   --  1.7  --   --   --   --   PHOS  --   --  2.4*  --   --   --   --    < > = values in this interval not displayed.   GFR: CrCl cannot  be calculated (Unknown ideal weight.). Liver Function Tests: Recent Labs  Lab 08/29/2019 2216 08/13/19 0456 08/15/19 0649  AST 83* 36 40  ALT 173* 76* 62*  ALKPHOS 72 57 62  BILITOT 2.1* 1.5* 1.3*  PROT 7.1 5.7* 5.6*  ALBUMIN 4.4 3.1* 3.1*   Recent Labs  Lab 08/27/2019 2216  LIPASE 27   Recent Labs  Lab 09/03/2019 2216  AMMONIA 25   Coagulation Profile: Recent Labs  Lab 08/24/2019 2216  INR 0.9   Cardiac Enzymes: No results for input(s): CKTOTAL, CKMB, CKMBINDEX, TROPONINI in the last 168 hours. BNP (last 3 results) No results for input(s): PROBNP in the last 8760 hours. HbA1C: No results for input(s): HGBA1C in the last 72 hours. CBG: No results for input(s): GLUCAP in the last 168 hours. Lipid Profile: No results for input(s): CHOL, HDL, LDLCALC, TRIG, CHOLHDL, LDLDIRECT in the last 72 hours. Thyroid Function Tests: Recent Labs    08/13/19 1634  TSH 1.620   Anemia Panel: No results for input(s): VITAMINB12, FOLATE, FERRITIN, TIBC, IRON, RETICCTPCT in the last 72 hours. Urine analysis:    Component Value Date/Time   COLORURINE YELLOW 08/16/2019 0231   APPEARANCEUR CLEAR 08/16/2019 0231   LABSPEC 1.014 08/16/2019 0231   PHURINE 6.0 08/16/2019 0231   GLUCOSEU NEGATIVE 08/16/2019 0231   HGBUR NEGATIVE 08/16/2019 0231   BILIRUBINUR NEGATIVE 08/16/2019 0231   BILIRUBINUR neg 09/25/2010 1503   KETONESUR 20 (A) 08/16/2019 0231   PROTEINUR NEGATIVE 08/16/2019 0231   UROBILINOGEN neg 09/25/2010 1503   NITRITE NEGATIVE 08/16/2019 0231   LEUKOCYTESUR NEGATIVE 08/16/2019 0231   Sepsis Labs: @LABRCNTIP (procalcitonin:4,lacticidven:4)  ) Recent Results (from the past 240 hour(s))  Respiratory Panel by RT PCR (Flu A&B, Covid) - Nasopharyngeal Swab     Status: None   Collection Time: 08/09/2019 10:16 PM   Specimen: Nasopharyngeal Swab  Result Value Ref Range Status   SARS Coronavirus 2 by RT PCR NEGATIVE NEGATIVE Final    Comment: (NOTE) SARS-CoV-2 target nucleic  acids are NOT DETECTED. The SARS-CoV-2 RNA is generally detectable in upper respiratoy specimens during the acute phase of infection. The lowest concentration of SARS-CoV-2 viral copies this assay can detect is 131 copies/mL. A negative result does not preclude SARS-Cov-2 infection and should not be used as the sole basis for treatment or other patient management decisions. A negative result may occur with  improper specimen collection/handling, submission of specimen other than nasopharyngeal swab, presence of viral mutation(s) within the areas targeted by  this assay, and inadequate number of viral copies (<131 copies/mL). A negative result must be combined with clinical observations, patient history, and epidemiological information. The expected result is Negative. Fact Sheet for Patients:  PinkCheek.be Fact Sheet for Healthcare Providers:  GravelBags.it This test is not yet ap proved or cleared by the [place] FDA and  has been authorized for detection and/or diagnosis of SARS-CoV-2 by FDA under an Emergency Use Authorization (EUA). This EUA will remain  in effect (meaning this test can be used) for the duration of the COVID-19 declaration under Section 564(b)(1) of the Act, 21 U.S.C. section 360bbb-3(b)(1), unless the authorization is terminated or revoked sooner.    Influenza A by PCR NEGATIVE NEGATIVE Final   Influenza B by PCR NEGATIVE NEGATIVE Final    Comment: (NOTE) The Xpert Xpress SARS-CoV-2/FLU/RSV assay is intended as an aid in  the diagnosis of influenza from Nasopharyngeal swab specimens and  should not be used as a sole basis for treatment. Nasal washings and  aspirates are unacceptable for Xpert Xpress SARS-CoV-2/FLU/RSV  testing. Fact Sheet for Patients: PinkCheek.be Fact Sheet for Healthcare Providers: GravelBags.it This test is not yet  approved or cleared by the [place] FDA and  has been authorized for detection and/or diagnosis of SARS-CoV-2 by  FDA under an Emergency Use Authorization (EUA). This EUA will remain  in effect (meaning this test can be used) for the duration of the  Covid-19 declaration under Section 564(b)(1) of the Act, 21  U.S.C. section 360bbb-3(b)(1), unless the authorization is  terminated or revoked. Performed at [facility], [place] [address] [place], [place] [address]       Studies: No results found.  Scheduled Meds: .  stroke: mapping our early stages of recovery book   Does not apply Once  . folic acid  1 mg Oral Daily  . hydrALAZINE  10 mg Oral Q6H  . levETIRAcetam  500 mg Oral BID  . multivitamin with minerals  1 tablet Oral Daily  . pantoprazole  40 mg Oral QHS  . senna-docusate  1 tablet Oral BID  . thiamine  100 mg Oral Daily   Or  . thiamine  100 mg Intravenous Daily    Continuous Infusions:   LOS: 6 days     Kayleen Memos, MD [facility] Pager [phone]  If 7PM-7AM, please contact night-coverage [email or web address] Password [email or web address] 08/16/2019, 1:45 PM

## 2019-08-17 ENCOUNTER — Emergency Department (HOSPITAL_COMMUNITY): Payer: Medicare Other

## 2019-08-17 ENCOUNTER — Inpatient Hospital Stay (HOSPITAL_COMMUNITY)
Admission: EM | Admit: 2019-08-17 | Discharge: 2019-09-05 | Disposition: E | Payer: Medicare Other | Source: Home / Self Care | Attending: Neurosurgery | Admitting: Neurosurgery

## 2019-08-17 DIAGNOSIS — J9691 Respiratory failure, unspecified with hypoxia: Secondary | ICD-10-CM

## 2019-08-17 DIAGNOSIS — R40243 Glasgow coma scale score 3-8, unspecified time: Secondary | ICD-10-CM

## 2019-08-17 DIAGNOSIS — R402431 Glasgow coma scale score 3-8, in the field [EMT or ambulance]: Secondary | ICD-10-CM

## 2019-08-17 DIAGNOSIS — E43 Unspecified severe protein-calorie malnutrition: Secondary | ICD-10-CM | POA: Insufficient documentation

## 2019-08-17 DIAGNOSIS — G9340 Encephalopathy, unspecified: Secondary | ICD-10-CM

## 2019-08-17 DIAGNOSIS — I10 Essential (primary) hypertension: Secondary | ICD-10-CM

## 2019-08-17 DIAGNOSIS — S065XAA Traumatic subdural hemorrhage with loss of consciousness status unknown, initial encounter: Secondary | ICD-10-CM | POA: Diagnosis present

## 2019-08-17 DIAGNOSIS — Z515 Encounter for palliative care: Secondary | ICD-10-CM

## 2019-08-17 LAB — DIFFERENTIAL
Abs Immature Granulocytes: 0.06 10*3/uL (ref 0.00–0.07)
Basophils Absolute: 0 10*3/uL (ref 0.0–0.1)
Basophils Relative: 1 %
Eosinophils Absolute: 0 10*3/uL (ref 0.0–0.5)
Eosinophils Relative: 1 %
Immature Granulocytes: 1 %
Lymphocytes Relative: 12 %
Lymphs Abs: 1 10*3/uL (ref 0.7–4.0)
Monocytes Absolute: 1.4 10*3/uL — ABNORMAL HIGH (ref 0.1–1.0)
Monocytes Relative: 17 %
Neutro Abs: 5.8 10*3/uL (ref 1.7–7.7)
Neutrophils Relative %: 68 %

## 2019-08-17 LAB — POCT I-STAT 7, (LYTES, BLD GAS, ICA,H+H)
Acid-base deficit: 2 mmol/L (ref 0.0–2.0)
Bicarbonate: 22 mmol/L (ref 20.0–28.0)
Calcium, Ion: 1.19 mmol/L (ref 1.15–1.40)
HCT: 39 % (ref 36.0–46.0)
Hemoglobin: 13.3 g/dL (ref 12.0–15.0)
O2 Saturation: 100 %
Patient temperature: 99
Potassium: 3.2 mmol/L — ABNORMAL LOW (ref 3.5–5.1)
Sodium: 125 mmol/L — ABNORMAL LOW (ref 135–145)
TCO2: 23 mmol/L (ref 22–32)
pCO2 arterial: 36.4 mmHg (ref 32.0–48.0)
pH, Arterial: 7.39 (ref 7.350–7.450)
pO2, Arterial: 175 mmHg — ABNORMAL HIGH (ref 83.0–108.0)

## 2019-08-17 LAB — COMPREHENSIVE METABOLIC PANEL
ALT: 54 U/L — ABNORMAL HIGH (ref 0–44)
AST: 37 U/L (ref 15–41)
Albumin: 3.6 g/dL (ref 3.5–5.0)
Alkaline Phosphatase: 71 U/L (ref 38–126)
Anion gap: 15 (ref 5–15)
BUN: 5 mg/dL — ABNORMAL LOW (ref 8–23)
CO2: 19 mmol/L — ABNORMAL LOW (ref 22–32)
Calcium: 8.6 mg/dL — ABNORMAL LOW (ref 8.9–10.3)
Chloride: 93 mmol/L — ABNORMAL LOW (ref 98–111)
Creatinine, Ser: 0.52 mg/dL (ref 0.44–1.00)
GFR calc Af Amer: >60 mL/min (ref >60)
GFR calc non Af Amer: >60 mL/min (ref >60)
Glucose, Bld: 179 mg/dL — ABNORMAL HIGH (ref 70–99)
Potassium: 3.4 mmol/L — ABNORMAL LOW (ref 3.5–5.1)
Sodium: 127 mmol/L — ABNORMAL LOW (ref 135–145)
Total Bilirubin: 1.5 mg/dL — ABNORMAL HIGH (ref 0.3–1.2)
Total Protein: 6.3 g/dL — ABNORMAL LOW (ref 6.5–8.1)

## 2019-08-17 LAB — URINALYSIS, ROUTINE W REFLEX MICROSCOPIC
Bilirubin Urine: NEGATIVE
Glucose, UA: 50 mg/dL — AB
Hgb urine dipstick: NEGATIVE
Ketones, ur: 20 mg/dL — AB
Leukocytes,Ua: NEGATIVE
Nitrite: NEGATIVE
Protein, ur: NEGATIVE mg/dL
Specific Gravity, Urine: 1.015 (ref 1.005–1.030)
pH: 7 (ref 5.0–8.0)

## 2019-08-17 LAB — RAPID URINE DRUG SCREEN, HOSP PERFORMED
Amphetamines: NOT DETECTED
Barbiturates: NOT DETECTED
Benzodiazepines: NOT DETECTED
Cocaine: NOT DETECTED
Opiates: NOT DETECTED
Tetrahydrocannabinol: NOT DETECTED

## 2019-08-17 LAB — CBC
HCT: 38.2 % (ref 36.0–46.0)
Hemoglobin: 13.8 g/dL (ref 12.0–15.0)
MCH: 34.7 pg — ABNORMAL HIGH (ref 26.0–34.0)
MCHC: 36.1 g/dL — ABNORMAL HIGH (ref 30.0–36.0)
MCV: 96 fL (ref 80.0–100.0)
Platelets: 325 10*3/uL (ref 150–400)
RBC: 3.98 MIL/uL (ref 3.87–5.11)
RDW: 12.9 % (ref 11.5–15.5)
WBC: 8.4 10*3/uL (ref 4.0–10.5)
nRBC: 0 % (ref 0.0–0.2)

## 2019-08-17 LAB — I-STAT CHEM 8, ED
BUN: 3 mg/dL — ABNORMAL LOW (ref 8–23)
Calcium, Ion: 1.02 mmol/L — ABNORMAL LOW (ref 1.15–1.40)
Chloride: 93 mmol/L — ABNORMAL LOW (ref 98–111)
Creatinine, Ser: 0.3 mg/dL — ABNORMAL LOW (ref 0.44–1.00)
Glucose, Bld: 175 mg/dL — ABNORMAL HIGH (ref 70–99)
HCT: 40 % (ref 36.0–46.0)
Hemoglobin: 13.6 g/dL (ref 12.0–15.0)
Potassium: 3.4 mmol/L — ABNORMAL LOW (ref 3.5–5.1)
Sodium: 127 mmol/L — ABNORMAL LOW (ref 135–145)
TCO2: 21 mmol/L — ABNORMAL LOW (ref 22–32)

## 2019-08-17 LAB — ETHANOL: Alcohol, Ethyl (B): <10 mg/dL (ref <10)

## 2019-08-17 LAB — PROTIME-INR
INR: 1 (ref 0.8–1.2)
Prothrombin Time: 12.5 seconds (ref 11.4–15.2)

## 2019-08-17 LAB — CBG MONITORING, ED: Glucose-Capillary: 164 mg/dL — ABNORMAL HIGH (ref 70–99)

## 2019-08-17 LAB — SARS CORONAVIRUS 2 BY RT PCR (HOSPITAL ORDER, PERFORMED IN ~~LOC~~ HOSPITAL LAB): SARS Coronavirus 2: NEGATIVE

## 2019-08-17 LAB — VITAMIN B6: Vitamin B6: 5.3 ug/L (ref 2.0–32.8)

## 2019-08-17 LAB — APTT: aPTT: 26 seconds (ref 24–36)

## 2019-08-17 MED ORDER — DOCUSATE SODIUM 50 MG/5ML PO LIQD
100.0000 mg | Freq: Two times a day (BID) | ORAL | Status: DC
  Filled 2019-08-17 (×2): qty 10

## 2019-08-17 MED ORDER — POLYETHYLENE GLYCOL 3350 17 G PO PACK: 17.0000 g | PACK | Freq: Every day | ORAL | Status: DC

## 2019-08-17 MED ORDER — ETOMIDATE 2 MG/ML IV SOLN
INTRAVENOUS | Status: AC | PRN
  Administered 2019-08-17: 20 mg via INTRAVENOUS

## 2019-08-17 MED ORDER — POTASSIUM CHLORIDE 20 MEQ/15ML (10%) PO SOLN
40.0000 meq | Freq: Once | ORAL | Status: AC
  Administered 2019-08-17: 40 meq
  Filled 2019-08-17: qty 30

## 2019-08-17 MED ORDER — MIDAZOLAM HCL 2 MG/2ML IJ SOLN: 1.0000 mg | INTRAMUSCULAR | Status: DC | PRN

## 2019-08-17 MED ORDER — DOCUSATE SODIUM 50 MG/5ML PO LIQD
100.0000 mg | Freq: Two times a day (BID) | ORAL | Status: DC
  Administered 2019-08-17 – 2019-08-21 (×6): 100 mg
  Filled 2019-08-17 (×5): qty 10

## 2019-08-17 MED ORDER — CLEVIDIPINE BUTYRATE 0.5 MG/ML IV EMUL
0.0000 mg/h | INTRAVENOUS | Status: DC
  Administered 2019-08-17: 2 mg/h via INTRAVENOUS
  Filled 2019-08-17: qty 50

## 2019-08-17 MED ORDER — ORAL CARE MOUTH RINSE
15.0000 mL | OROMUCOSAL | Status: DC
  Administered 2019-08-18 – 2019-08-21 (×37): 15 mL via OROMUCOSAL

## 2019-08-17 MED ORDER — LEVETIRACETAM IN NACL 500 MG/100ML IV SOLN
500.0000 mg | Freq: Two times a day (BID) | INTRAVENOUS | Status: DC
  Administered 2019-08-18 – 2019-08-21 (×7): 500 mg via INTRAVENOUS
  Filled 2019-08-17 (×8): qty 100

## 2019-08-17 MED ORDER — FENTANYL CITRATE (PF) 100 MCG/2ML IJ SOLN: 25.0000 ug | INTRAMUSCULAR | Status: DC | PRN

## 2019-08-17 MED ORDER — CALCIUM GLUCONATE-NACL 1-0.675 GM/50ML-% IV SOLN
1.0000 g | Freq: Once | INTRAVENOUS | Status: AC
  Administered 2019-08-17: 1000 mg via INTRAVENOUS
  Filled 2019-08-17: qty 50

## 2019-08-17 MED ORDER — CHLORHEXIDINE GLUCONATE CLOTH 2 % EX PADS
6.0000 | MEDICATED_PAD | Freq: Every day | CUTANEOUS | Status: DC
  Administered 2019-08-17 – 2019-08-21 (×2): 6 via TOPICAL

## 2019-08-17 MED ORDER — CHLORHEXIDINE GLUCONATE 0.12% ORAL RINSE (MEDLINE KIT)
15.0000 mL | Freq: Two times a day (BID) | OROMUCOSAL | Status: DC
  Administered 2019-08-18 – 2019-08-21 (×6): 15 mL via OROMUCOSAL

## 2019-08-17 MED ORDER — THIAMINE HCL 100 MG/ML IJ SOLN
100.0000 mg | Freq: Every day | INTRAMUSCULAR | Status: DC
  Administered 2019-08-18 – 2019-08-21 (×4): 100 mg via INTRAVENOUS
  Filled 2019-08-17 (×4): qty 2

## 2019-08-17 MED ORDER — ONDANSETRON HCL 4 MG/2ML IJ SOLN: 4.0000 mg | Freq: Four times a day (QID) | INTRAMUSCULAR | Status: DC | PRN

## 2019-08-17 MED ORDER — PROPOFOL 1000 MG/100ML IV EMUL
INTRAVENOUS | Status: AC
  Filled 2019-08-17: qty 100

## 2019-08-17 MED ORDER — LEVETIRACETAM IN NACL 500 MG/100ML IV SOLN
500.0000 mg | Freq: Two times a day (BID) | INTRAVENOUS | Status: DC
  Administered 2019-08-18: 500 mg via INTRAVENOUS

## 2019-08-17 MED ORDER — POTASSIUM CHLORIDE IN NACL 20-0.9 MEQ/L-% IV SOLN
INTRAVENOUS | Status: DC
  Filled 2019-08-17 (×3): qty 1000

## 2019-08-17 MED ORDER — MORPHINE SULFATE (PF) 2 MG/ML IV SOLN
2.0000 mg | INTRAVENOUS | Status: DC | PRN
  Administered 2019-08-21: 2 mg via INTRAVENOUS
  Filled 2019-08-17: qty 2

## 2019-08-17 MED ORDER — POLYETHYLENE GLYCOL 3350 17 G PO PACK
17.0000 g | PACK | Freq: Every day | ORAL | Status: DC
  Administered 2019-08-19 – 2019-08-21 (×3): 17 g
  Filled 2019-08-17 (×4): qty 1

## 2019-08-17 MED ORDER — FENTANYL CITRATE (PF) 100 MCG/2ML IJ SOLN
25.0000 ug | INTRAMUSCULAR | Status: DC | PRN
  Administered 2019-08-19: 100 ug via INTRAVENOUS
  Filled 2019-08-17 (×2): qty 2

## 2019-08-17 MED ORDER — PANTOPRAZOLE SODIUM 40 MG IV SOLR
40.0000 mg | Freq: Every day | INTRAVENOUS | Status: DC
  Administered 2019-08-18 – 2019-08-21 (×4): 40 mg via INTRAVENOUS
  Filled 2019-08-17 (×4): qty 40

## 2019-08-17 MED ORDER — MANNITOL 20 % IV SOLN
50.0000 g | Freq: Once | Status: AC
  Administered 2019-08-17: 50 g via INTRAVENOUS
  Filled 2019-08-17: qty 250

## 2019-08-17 MED ORDER — FOLIC ACID 5 MG/ML IJ SOLN
1.0000 mg | Freq: Every day | INTRAMUSCULAR | Status: DC
  Administered 2019-08-18 – 2019-08-21 (×4): 1 mg via INTRAVENOUS
  Filled 2019-08-17 (×5): qty 0.2

## 2019-08-17 MED ORDER — PROPOFOL 1000 MG/100ML IV EMUL
5.0000 ug/kg/min | INTRAVENOUS | Status: DC
  Administered 2019-08-17: 20 ug/kg/min via INTRAVENOUS

## 2019-08-17 MED ORDER — ENOXAPARIN SODIUM 40 MG/0.4ML ~~LOC~~ SOLN
40.0000 mg | SUBCUTANEOUS | Status: DC
  Administered 2019-08-17: 40 mg via SUBCUTANEOUS
  Filled 2019-08-17: qty 0.4

## 2019-08-17 NOTE — Code Documentation (Addendum)
Responded to Code Stroke called at 1934 for AMS, [phone]. Pt had been discharged yesterday after being admitted for L SDH with 35mm midline shift. Per EMS, pt had sudden onset unresponsiveness with R facial droop. Pt arrived at 1945, NIH-29, CBG-175, CT delayed d/t need for intubation for airway protection. Pt intubated at 1957 after being given 20mg  etomidate and 100mg  rocuronium. CT head-increase in L SDH with 2cm L to R midline shift, evidence of ventricular trapping and transependymal flow of CSF at the R lateral ventricle, small volume SAH. Pt taken back to room, propofol gtt started and 50g 20% mannitol given per MD order. Plan for neurosurgery consult and admission to ICU.

## 2019-08-17 NOTE — Progress Notes (Signed)
Spoke to Mia, LPN at [facility] to give report on this patient. Mia verbalized understanding and I was able to answer all questions. Patient only has a vase of flowers in the room, no other personal items found. Patient is dressed in temporary scrubs and is ready for transport at this time.

## 2019-08-17 NOTE — Consult Note (Signed)
Requesting Physician: Dr. Fredderick Phenix    Chief Complaint:  History obtained from: Patient and Chart     HPI:                                                                                                                                       Olivia Harris is a 69 y.o. female with past medical history of hypertension, ADHD discharged earlier today with left subdural hemorrhage with 4 mm midline shift, was not operated due to stable midline shift and exam on discharge. Last known normal 7 PM, per nursing staff patient suddenly became unresponsive.  EMS was called who evaluated the patient-was unresponsive, ?  Left facial droop.  Blood pressure greater than 200 systolic.  Brought to [facility], ER as code stroke.  On assessment, patient had fixed pupils, doll's reflex was negative.  Gag reflex positive.  Flexor posturing on exam.  Intubated immediately for airway protection and stat CT head showed expansion of the subdural hemorrhage with significant midline shift and tonsillar and uncal herniation.  Requested EDP to consult neurosurgery immediately.   Date last known well: 5 13-21 Time last known well: 7 PM tPA Given: No, subdural hemorrhage   Past Medical History:  Diagnosis Date  . ADHD (attention deficit hyperactivity disorder) Dr.Steiner  . Anxiety Dr.Steiner  . Arthritis   . Decreased libido   . Depression Dr.Steiner  . Essential hypertension, benign  07/29/2012  . Onychomycosis   . Postmenopausal   . Scoliosis   . Scoliosis    degenerative   . Seborrheic dermatitis     Past Surgical History:  Procedure Laterality Date  . arthroscopic surgery shoulder  right, 1994(Dr.Rowan) bone spur removed    Family History  Problem Relation Age of Onset  . Thyroid disease Mother   . Arthritis Mother   . Heart disease Mother        CHF  . Hypertension Brother   . Arthritis Sister   . Arthritis Other        grandmother  . Hyperlipidemia Sister   . Heart disease Other    grandmother  . Stroke Other        grandfather  . Hypertension Brother    Social History:  reports that she has quit smoking. Her smoking use included cigarettes. She smoked 0.20 packs per day. She has never used smokeless tobacco. She reports current alcohol use. She reports that she does not use drugs.  Allergies:  Allergies  Allergen Reactions  . Antihistamines, Chlorpheniramine-Type Other (See Comments)    hyperactivity    Medications:  I reviewed home medications   ROS:                                                                                                                                     Unable to obtain due to patient's mental status   Examination:                                                                                                      General: Appears well-developed  Psych: Unresponsive Eyes: No scleral injection HENT: No OP obstrucion Head: Normocephalic.  Cardiovascular: Normal rate and regular rhythm. Respiratory: Effort normal and breath sounds normal to anterior ascultation GI: Soft.  No distension. There is no tenderness.  Skin: WDI    Neurological Examination Mental Status: Comatose, nonverbal not following any commands.  GCS 5 Cranial Nerves: II: Visual fields: No blink to threat, pupils are reactive 7 mm bilaterally III,IV, VI: ptosis not present, doll's reflex absent V,VII: No obvious facial symmetry IX,X: Gag reflex present Motor: Flexes to noxious stimulus bilateral upper extremities, flexor posturing.  No movement bilateral lower extremities. Tone and bulk:normal tone throughout; no atrophy noted Sensory: Unable to assess Plantars: Right: downgoing   Left: downgoing Cerebellar: Unable to assess      Lab Results: Basic Metabolic Panel: Recent Labs  Lab 08/11/19 0516 08/11/19 0516 08/11/19 1627  08/11/19 1627 08/12/19 0354 08/13/19 0456 08/15/19 0649 08/16/19 0737 08/16/2019 1959  NA 129*   < > 128*  --  128* 129* 131*  --  127*  K 3.0*   < > 3.5   < > 4.6 4.0 3.3* 3.5 3.4*  CL 94*   < > 93*  --  94* 96* 94*  --  93*  CO2 21*  --  21*  --  19* 21* 26  --   --   GLUCOSE 105*   < > 99  --  81 87 92  --  175*  BUN 5*   < > 7*  --  6* 5* <5*  --  3*  CREATININE 0.47   < > 0.61  --  0.54 0.55 0.50  --  0.30*  CALCIUM 8.2*   < > 8.7*   < > 8.5* 8.8* 8.8*  --   --   MG  --   --  1.7  --   --   --   --   --   --   PHOS  --   --  2.4*  --   --   --   --   --   --    < > =  values in this interval not displayed.    CBC: Recent Labs  Lab 08/23/2019 2216 08/15/19 0649 08-30-2019 1954 08/30/2019 1959  WBC 9.1 4.0 8.4  --   NEUTROABS 7.4 2.3 5.8  --   HGB 15.1* 12.4 13.8 13.6  HCT 41.1 34.8* 38.2 40.0  MCV 94.7 95.3 96.0  --   PLT 165 214 325  --     Coagulation Studies: Recent Labs    08/30/19 1954  LABPROT 12.5  INR 1.0    Imaging: DG Chest Portable 1 View  Result Date: 08/30/2019 CLINICAL DATA:  Endotracheal tube placement. EXAM: PORTABLE CHEST 1 VIEW COMPARISON:  None. FINDINGS: The heart size and mediastinal contours are within normal limits. Endotracheal tube is in good position. No pneumothorax is noted. Right lung is clear. Minimal left basilar atelectasis is noted with possible small left pleural effusion. The visualized skeletal structures are unremarkable. IMPRESSION: Endotracheal tube in good position. Minimal left basilar atelectasis is noted with possible small left pleural effusion. Electronically Signed   By: Lupita Raider M.D.   On: 30-Aug-2019 20:14     ASSESSMENT AND PLAN   69 y.o. female with past medical history of hypertension, ADHD discharged earlier today with left subdural hemorrhage with 4 mm midline shift -now returns comatose due to expansion of left subdural hemorrhage with possible and uncal herniation. Given patient's exam and CT findings, will  likely have very poor prognosis.  Consider IV mannitol as a temporizing measure until neurosurgery evaluates and considers evacuation versus goals of cares discussion.  Expansion of left-sided subdural hemorrhage with midline shift and uncal and tonsillar herniation   Recommendations -Stat neurosurgery consult -Consider IV mannitol -Blood pressure goal less than 160 systolic -Goals of care discussion  Reham Slabaugh [facility] Pager Number [phone]

## 2019-08-17 NOTE — Plan of Care (Signed)
  Problem: Nutrition: Goal: Risk of aspiration will decrease Outcome: Progressing   Problem: Clinical Measurements: Goal: Will remain free from infection Outcome: Progressing   Problem: Activity: Goal: Risk for activity intolerance will decrease Outcome: Progressing   Problem: Nutrition: Goal: Adequate nutrition will be maintained Outcome: Progressing   Problem: Coping: Goal: Level of anxiety will decrease Outcome: Progressing

## 2019-08-17 NOTE — TOC Transition Note (Addendum)
Transition of Care [facility]) - CM/SW Discharge Note   Patient Details  Name: Olivia Harris MRN: [id number] Date of Birth: Nov 15, 1950  [facility] CM/SW Contact:  Luiz Blare Phone Number: 08/20/2019, 2:13 PM   Clinical Narrative:    Patient will DC to: [facility]  Anticipated DC date: 5/13 Family notified: [name], friend Transport by: Sharin Mons   Per MD patient ready for DC to [facility]. RN, patient, patient's family, and facility notified of DC. Discharge Summary and FL2 sent to facility. RN to call report prior to discharge [phone] Room 3221). DC packet on chart. Ambulance transport requested for patient.   CSW will sign off for now as social work intervention is no longer needed. Please consult [place] again if new needs arise.    Final next level of care: Skilled Nursing Facility Barriers to Discharge: No Barriers Identified   Patient Goals and CMS Choice Patient states their goals for this hospitalization and ongoing recovery are:: for her to get stronger again CMS Medicare.gov Compare Post Acute Care list provided to:: Patient Choice offered to / list presented to : Patient  Discharge Placement              Patient [facility] bed at: [facility] Patient to be transferred to facility by: [facility]   Patient and family notified of of transfer: 08/16/2019  Discharge Plan and Services In-house Referral: Clinical Social Work Discharge Planning Services: CM Consult Post Acute Care Choice: Skilled Nursing Facility                               Social Determinants of Health (SDOH) Interventions     Readmission Risk Interventions No flowsheet data found.

## 2019-08-17 NOTE — Consult Note (Signed)
NAME:  Olivia Harris, MRN:  [id number], DOB:  [birth year], LOS: 0 ADMISSION DATE:  08/22/2019, CONSULTATION DATE:  08/30/2019 REFERRING MD:  Arnoldo Morale  CHIEF COMPLAINT:  AMS   Brief History   Olivia Harris is a 69 y.o. female who was admitted with large left SDH with herniation.  Required intubation in ED and PCCM asked to assist with vent management.  History of present illness   Pt is encephelopathic; therefore, this HPI is obtained from chart review. Olivia Harris is a 69 y.o. female who has a PMH as outlined below.  She was initially admitted Aug 11, 2019 with acute L SDH.  F/u CT 5/7 showed stable SDH.  She was eventually discharged 5/13 to SNF.  While at SNF, she became altered later that evening. She was brought back to ED where CT demonstrated large acute left SDH with uncal herniation and significant MLS.  She was intubated for GCS 3.  Neurosurgery was consulted and is admitting pt while attempting to contact the family.  They have deemed surgical intervention to be futile.  PCCM asked to assist with vent management.  Past Medical History  .has Osteopenia, last dexa 07/2012, Ca Vit D Exercise, repeat 2 years; Essential hypertension, benign ; Vaginal bleeding - followed by Dr. Briant Sites ob/gyn; Major depressive disorder, single episode, moderate degree ([facility]); Subdural hematoma ([place]); Alcohol abuse; and Hyponatremia on their problem list.  Olivia Hospital Events   5/13 > admitted with large left SDH with uncal herniation and MLS.  Consults:  Neuro, PCCM.  Procedures:  ETT 5/13 >   Significant Diagnostic Tests:  CT head 5/13 > large left SDH with 2cm MLS.  Associated small volume SAH.  Micro Data:  COVID 5/13 >   Antimicrobials:  None.   Interim history/subjective:  Unresponsive.  Objective:  Blood pressure (!) 170/105, pulse 100, resp. rate 15, height 5\' 5"  (1.651 m), SpO2 100 %.    Vent Mode: PRVC FiO2 (%):  [70 %] 70 % Set Rate:  [15 bmp] 15 bmp Vt Set:   [460 mL] 460 mL PEEP:  [5 cmH20] 5 cmH20 Plateau Pressure:  [15 cmH20] 15 cmH20  No intake or output data in the 24 hours ending 09/04/2019 2126 There were no vitals filed for this visit.  Examination: General: Adult female, in NAD. Neuro: Unresponsive. HEENT: [place]/AT. Sclerae anicteric.  ETT in place. Cardiovascular: RRR, no M/R/G.  Lungs: Respirations even and unlabored.  CTA bilaterally, No W/R/R.  Abdomen: BS x 4, soft, NT/ND.  Musculoskeletal: No gross deformities, no edema.  Skin: Intact, warm, no rashes.  Assessment & Plan:   Large left SDH with uncal herniation and significant MLS - not a surgical candidate due to futility per neurosurgery. - Supportive care. - Continue keppra.   Hypertension. - Cleviprex PRN for goal SBP < 140. - Hold home hydralazine.  Respiratory insufficiency - 2/2 above. - Full vent support. - No weaning due to mental status. - Bronchial hygiene. - Follow CXR.  Hyponatremia - chronic. Hypokalemia. Hypocalcemia. - NS + 75. - 40 mEq K per tube. - 1g Ca gluconate.  EtOH abuse. - Thiamine, folate.  Rest per primary.  Best Practice:  Diet: NPO. Pain/Anxiety/Delirium protocol (if indicated): Fentanyl PRN / Midazolam PRN.  RASS goal 0 to -1. VAP protocol (if indicated): In place. DVT prophylaxis: SCD's only. GI prophylaxis: PPI. Glucose control: SSI if glucose consistently > 180. Mobility: Bedrest. Code Status: Full - neurosurgery attempting to contact family. Family Communication: Per primary. Disposition:  ICU.  Labs   CBC: Recent Labs  Lab 08/19/2019 2216 08/15/19 0649 Aug 26, 2019 1954 08-26-2019 1959  WBC 9.1 4.0 8.4  --   NEUTROABS 7.4 2.3 5.8  --   HGB 15.1* 12.4 13.8 13.6  HCT 41.1 34.8* 38.2 40.0  MCV 94.7 95.3 96.0  --   PLT 165 214 325  --    Basic Metabolic Panel: Recent Labs  Lab 08/11/19 1627 08/11/19 1627 08/12/19 0354 08/12/19 0354 08/13/19 0456 08/15/19 0649 08/16/19 0737 2019/08/26 1954 Aug 26, 2019 1959  NA 128*    < > 128*  --  129* 131*  --  127* 127*  K 3.5   < > 4.6   < > 4.0 3.3* 3.5 3.4* 3.4*  CL 93*   < > 94*  --  96* 94*  --  93* 93*  CO2 21*  --  19*  --  21* 26  --  19*  --   GLUCOSE 99   < > 81  --  87 92  --  179* 175*  BUN 7*   < > 6*  --  5* <5*  --  5* 3*  CREATININE 0.61   < > 0.54  --  0.55 0.50  --  0.52 0.30*  CALCIUM 8.7*  --  8.5*  --  8.8* 8.8*  --  8.6*  --   MG 1.7  --   --   --   --   --   --   --   --   PHOS 2.4*  --   --   --   --   --   --   --   --    < > = values in this interval not displayed.   GFR: CrCl cannot be calculated (Unknown ideal weight.). Recent Labs  Lab 08/20/2019 2216 08/15/19 0649 08/26/2019 1954  WBC 9.1 4.0 8.4  LATICACIDVEN 1.8  --   --    Liver Function Tests: Recent Labs  Lab 08/31/2019 2216 08/13/19 0456 08/15/19 0649 08/26/19 1954  AST 83* 36 40 37  ALT 173* 76* 62* 54*  ALKPHOS 72 57 62 71  BILITOT 2.1* 1.5* 1.3* 1.5*  PROT 7.1 5.7* 5.6* 6.3*  ALBUMIN 4.4 3.1* 3.1* 3.6   Recent Labs  Lab 08/18/2019 2216  LIPASE 27   Recent Labs  Lab 08/05/2019 2216  AMMONIA 25   ABG    Component Value Date/Time   HCO3 24.6 08/24/2019 2216   TCO2 21 (L) 2019-08-26 1959   O2SAT 37.0 08/14/2019 2216    Coagulation Profile: Recent Labs  Lab 08/16/2019 2216 08/26/19 1954  INR 0.9 1.0   Cardiac Enzymes: No results for input(s): CKTOTAL, CKMB, CKMBINDEX, TROPONINI in the last 168 hours. HbA1C: Hgb A1c MFr Bld  Date/Time Value Ref Range Status  02/04/2012 05:10 PM 5.2 4.6 - 6.5 % Final    Comment:    Glycemic Control Guidelines for People with Diabetes:Non Diabetic:  <6%Goal of Therapy: <7%Additional Action Suggested:  >8%    CBG: Recent Labs  Lab Aug 26, 2019 1948  GLUCAP 164*    Review of Systems:   Unable to obtain as pt is encephalopathic.  Past medical history  She,  has a past medical history of ADHD (attention deficit hyperactivity disorder) (Dr.Steiner), Anxiety (Dr.Steiner), Arthritis, Decreased libido, Depression  (Dr.Steiner), Essential hypertension, benign  (07/29/2012), Onychomycosis, Postmenopausal, Scoliosis, Scoliosis, and Seborrheic dermatitis.   Surgical History    Past Surgical History:  Procedure Laterality Date  . arthroscopic  surgery shoulder  right, 1994(Dr.Rowan) bone spur removed     Social History   reports that she has quit smoking. Her smoking use included cigarettes. She smoked 0.20 packs per day. She has never used smokeless tobacco. She reports current alcohol use. She reports that she does not use drugs.   Family history   Her family history includes Arthritis in her mother, sister, and another family member; Heart disease in her mother and another family member; Hyperlipidemia in her sister; Hypertension in her brother and brother; Stroke in an other family member; Thyroid disease in her mother.   Allergies Allergies  Allergen Reactions  . Antihistamines, Chlorpheniramine-Type Other (See Comments)    hyperactivity     Home meds  Prior to Admission medications   Medication Sig Start Date End Date Taking? Authorizing Provider  folic acid (FOLVITE) 1 MG tablet Take 1 tablet (1 mg total) by mouth daily. Sep 09, 2019 11/15/19  Darlin Drop, DO  hydrALAZINE (APRESOLINE) 10 MG tablet Take 1 tablet (10 mg total) by mouth 4 (four) times daily. 08/16/19 11/14/19  Darlin Drop, DO  levETIRAcetam (KEPPRA) 500 MG tablet Take 1 tablet (500 mg total) by mouth 2 (two) times daily for 1 day. 08/16/19 September 09, 2019  Darlin Drop, DO  Multiple Vitamin (MULTIVITAMIN WITH MINERALS) TABS tablet Take 1 tablet by mouth daily. 09/09/19 11/15/19  Darlin Drop, DO  pantoprazole (PROTONIX) 40 MG tablet Take 1 tablet (40 mg total) by mouth at bedtime. 08/16/19 11/14/19  Darlin Drop, DO  thiamine 100 MG tablet Take 1 tablet (100 mg total) by mouth daily. Sep 09, 2019   Darlin Drop, DO    Critical care time: 35 min.    Rutherford Guys, [facility] 2019-09-09, 9:26 PM

## 2019-08-17 NOTE — H&P (Signed)
Subjective: The patient is a 69 year old white female alcoholic who was admitted on 08/05/2019 with an acute left subdural hematoma.  A follow-up scan on 08/11/2019 demonstrated a stable subdural hematoma.  The patient was stable and observed in hospital until earlier today when she was transferred to a skilled nursing facility.  By report the patient became obtunded in the nursing home and the patient was brought back to the ER.  The patient was intubated.  The patient was worked up further with a head CT which demonstrated a large acute left subdural hematoma with uncal herniation and significant midline shift.  I was called and came in to see the patient immediately.  Presently the patient is comatose and intubated there are no family members available. Past Medical History:  Diagnosis Date  . ADHD (attention deficit hyperactivity disorder) Dr.Steiner  . Anxiety Dr.Steiner  . Arthritis   . Decreased libido   . Depression Dr.Steiner  . Essential hypertension, benign  07/29/2012  . Onychomycosis   . Postmenopausal   . Scoliosis   . Scoliosis    degenerative   . Seborrheic dermatitis     Past Surgical History:  Procedure Laterality Date  . arthroscopic surgery shoulder  right, 1994(Dr.Rowan) bone spur removed    Allergies  Allergen Reactions  . Antihistamines, Chlorpheniramine-Type Other (See Comments)    hyperactivity    Social History   Tobacco Use  . Smoking status: Former Smoker    Packs/day: 0.20    Types: Cigarettes  . Smokeless tobacco: Never Used  Substance Use Topics  . Alcohol use: Yes    Family History  Problem Relation Age of Onset  . Thyroid disease Mother   . Arthritis Mother   . Heart disease Mother        CHF  . Hypertension Brother   . Arthritis Sister   . Arthritis Other        grandmother  . Hyperlipidemia Sister   . Heart disease Other        grandmother  . Stroke Other        grandfather  . Hypertension Brother    Prior to Admission medications    Medication Sig Start Date End Date Taking? Authorizing Provider  folic acid (FOLVITE) 1 MG tablet Take 1 tablet (1 mg total) by mouth daily. 08/26/2019 11/15/19  Kayleen Memos, DO  hydrALAZINE (APRESOLINE) 10 MG tablet Take 1 tablet (10 mg total) by mouth 4 (four) times daily. 08/16/19 11/14/19  Kayleen Memos, DO  levETIRAcetam (KEPPRA) 500 MG tablet Take 1 tablet (500 mg total) by mouth 2 (two) times daily for 1 day. 08/16/19 Aug 21, 2019  Kayleen Memos, DO  Multiple Vitamin (MULTIVITAMIN WITH MINERALS) TABS tablet Take 1 tablet by mouth daily. 08/23/2019 11/15/19  Kayleen Memos, DO  pantoprazole (PROTONIX) 40 MG tablet Take 1 tablet (40 mg total) by mouth at bedtime. 08/16/19 11/14/19  Kayleen Memos, DO  thiamine 100 MG tablet Take 1 tablet (100 mg total) by mouth daily. 08/09/2019   Kayleen Memos, DO     Review of Systems  Positive ROS: Unobtainable  All other systems have been reviewed and were otherwise negative with the exception of those mentioned in the HPI and as above.  Objective: Vital signs in last 24 hours: Temp:  [98.6 F (37 C)-99.4 F (37.4 C)] 98.6 F (37 C) (05/13 1521) Pulse Rate:  [53-121] 100 (05/13 2044) Resp:  [13-20] 15 (05/13 2044) BP: (129-216)/(69-135) 170/105 (05/13 2044) SpO2:  [95 %-  100 %] 100 % (05/13 2044) FiO2 (%):  [70 %] 70 % (05/13 2000) Estimated body mass index is 24.96 kg/m as calculated from the following:   Height as of this encounter: 5\' 5"  (1.651 m).   Weight as of 07/19/19: 68 kg.   Physical exam:  General: A comatose intubated 69 year old white female in no apparent distress.  HEENT: The patient's left pupil is approximately 7 mm and nonreactive.  Her right pupil is approximately 5 mm and nonreactive.  Neck: Unremarkable  Thorax: Symmetric  Abdomen: Soft  Extremities: The patient has old bruising/ecchymosis on her bilateral knees  Neurologic exam: Glasgow Coma Scale 3 intubated, the patient pupils are as above.  She has no corneal reflexes.   She does have a cough reflex.  She is not breathing over the ventilator.  There is no response to noxious stimuli.  I have reviewed the patient's head CT performed at [facility] today.  She has an acute left large subdural hematoma with severe midline shift.  There is left uncal herniation.  The brainstem appears almond-shaped and hypodense. Data Review Lab Results  Component Value Date   WBC 8.4 08/31/2019   HGB 13.6 08/28/2019   HCT 40.0 08/23/2019   MCV 96.0 08/08/2019   PLT 325 08/26/2019   Lab Results  Component Value Date   NA 127 (L) 08/15/2019   K 3.4 (L) 08/28/2019   CL 93 (L) 08/28/2019   CO2 19 (L) 08/13/2019   BUN 3 (L) 08/15/2019   CREATININE 0.30 (L) 08/23/2019   GLUCOSE 175 (H) 08/23/2019   Lab Results  Component Value Date   INR 1.0 08/18/2019    Assessment/Plan: Left subdural hematoma: The patient shows minimal signs of brain activity.  At this point surgery would be futile.  I will admit the patient and we will attempt to contact family members for discussion about withdrawal of support.  Critical care medicine has kindly agreed to manage the ventilator and medical issues.  08/19/2019 08/13/2019 9:16 PM

## 2019-08-17 NOTE — Progress Notes (Signed)
PROGRESS NOTE  Olivia Harris ZOX:[id number] DOB: [birth year] DOA: Aug 22, 2019 PCP: Patient, No Pcp Per  HPI/Recap of past 74 hours: 69 year old female with a history of alcohol abuse, hypertension, depression, anxiety, ADHD who came to ED with complaints of anxiety. Patient was found to be confused in the ED. She underwent CT head which showed crescentic hyperdense subdural hemorrhage extending across the cerebral hemispheres most pronounced over the left frontal and temporal lobes along the falx and left tentorium. Mild mass-effect with global sulcal effacement and left right midline shift of 4 mm. Neurosurgery saw the patient and recommended no invasive intervention. Patient also was placed under IVC letter due to suicidal ideation.Seen by psych.She denies current suicidal ideation. Recommended outpatient follow-up.  08/08/2019: Seen and examined.  More alert and interactive.  Pleasantly confused.  TOC working on placement.    Assessment/Plan: Principal Problem:   Subdural hematoma (HCC) Active Problems:   Major depressive disorder, single episode, moderate degree (HCC)   Alcohol abuse   Hyponatremia   Subdural hematoma, nonoperative per neurosurgery- Seen by neurosurgery. No invasive intervention planned. No reported seizures activity Complete seizures prophylaxis Keppra 500 mg twice daily x 7 days, day# 7/7.  Follow up with neurosurgery Dr. Franky Macho outpatient.  Alcohol abuse/depression/suicidal attempt- Patient was IVC by ER physician. Seen by psych; outpatient follow up recommended.  Continue p.o.MV, thiamine, folic acid.  Resolving hypovolemic Hyponatremia Serum sodium 131. Encourage increase oral intake Avoid use of alcohol  Transaminitis- Presented with elevated LFTs in setting of alcohol use. AST, ALT and T bili have improved and continue to trend down.  Resolved post repletion:Hypokalemia Serum potassium 3.5 Follow-up with your  PCP  Ambulatory dysfunction In the setting of acute CVA PT OT recs SNF.  TOC assisting with placement.   Code Status:Full code   Consult: Neurosurgery, psych.    DVT prophylaxis: SCDs;  Code Status: Full code  Family Communication: No family at bedside  Disposition Plan:   Consult: Neurosurgery, psych.    Status is: Inpatient   Dispo: The patient is from: Home              Anticipated d/c is to: SNF              Anticipated d/c date is: pending bed placement              Patient currently awaiting SNF placement.       Objective: Vitals:   08/16/19 2338 08/20/2019 0354 08/07/2019 0740 09/01/2019 0744  BP: 139/77 (!) 141/83 (!) 141/85 129/69  Pulse: 86 88 87 75  Resp: 14 19  16   Temp: 98.7 F (37.1 C) 99 F (37.2 C)  99.4 F (37.4 C)  TempSrc:    Oral  SpO2: 97% 98% 97% 95%    Intake/Output Summary (Last 24 hours) at 08/23/2019 0926 Last data filed at 08/20/2019 08/19/2019 Gross per 24 hour  Intake 500 ml  Output --  Net 500 ml   There were no vitals filed for this visit.  Exam: No significant change from prior exam.  . General: 69 y.o. year-old female Well developed well nourished in NAD . Cardiovascular: RRR no rubs or gallops . Respiratory:  CTA no wheezes or rales . Abdomen: soft NT ND NBS . Musculoskeletal: No LE edema . Psychiatry: Mood is appropriate  Data Reviewed: CBC: Recent Labs  Lab 08-22-19 2216 08/15/19 0649  WBC 9.1 4.0  NEUTROABS 7.4 2.3  HGB 15.1* 12.4  HCT 41.1 34.8*  MCV 94.7 95.3  PLT 165 062   Basic Metabolic Panel: Recent Labs  Lab 08/11/19 0516 08/11/19 0516 08/11/19 1627 08/12/19 0354 08/13/19 0456 08/15/19 0649 08/16/19 0737  NA 129*  --  128* 128* 129* 131*  --   K 3.0*   < > 3.5 4.6 4.0 3.3* 3.5  CL 94*  --  93* 94* 96* 94*  --   CO2 21*  --  21* 19* 21* 26  --   GLUCOSE 105*  --  99 81 87 92  --   BUN 5*  --  7* 6* 5* <5*  --   CREATININE 0.47  --  0.61 0.54 0.55 0.50  --   CALCIUM 8.2*  --   8.7* 8.5* 8.8* 8.8*  --   MG  --   --  1.7  --   --   --   --   PHOS  --   --  2.4*  --   --   --   --    < > = values in this interval not displayed.   GFR: CrCl cannot be calculated (Unknown ideal weight.). Liver Function Tests: Recent Labs  Lab 08/27/2019 2216 08/13/19 0456 08/15/19 0649  AST 83* 36 40  ALT 173* 76* 62*  ALKPHOS 72 57 62  BILITOT 2.1* 1.5* 1.3*  PROT 7.1 5.7* 5.6*  ALBUMIN 4.4 3.1* 3.1*   Recent Labs  Lab 08/19/2019 2216  LIPASE 27   Recent Labs  Lab 08/13/2019 2216  AMMONIA 25   Coagulation Profile: Recent Labs  Lab 08/07/2019 2216  INR 0.9   Cardiac Enzymes: No results for input(s): CKTOTAL, CKMB, CKMBINDEX, TROPONINI in the last 168 hours. BNP (last 3 results) No results for input(s): PROBNP in the last 8760 hours. HbA1C: No results for input(s): HGBA1C in the last 72 hours. CBG: No results for input(s): GLUCAP in the last 168 hours. Lipid Profile: No results for input(s): CHOL, HDL, LDLCALC, TRIG, CHOLHDL, LDLDIRECT in the last 72 hours. Thyroid Function Tests: No results for input(s): TSH, T4TOTAL, FREET4, T3FREE, THYROIDAB in the last 72 hours. Anemia Panel: No results for input(s): VITAMINB12, FOLATE, FERRITIN, TIBC, IRON, RETICCTPCT in the last 72 hours. Urine analysis:    Component Value Date/Time   COLORURINE YELLOW 08/16/2019 0231   APPEARANCEUR CLEAR 08/16/2019 0231   LABSPEC 1.014 08/16/2019 0231   PHURINE 6.0 08/16/2019 0231   GLUCOSEU NEGATIVE 08/16/2019 0231   HGBUR NEGATIVE 08/16/2019 0231   BILIRUBINUR NEGATIVE 08/16/2019 0231   BILIRUBINUR neg 09/25/2010 1503   KETONESUR 20 (A) 08/16/2019 0231   PROTEINUR NEGATIVE 08/16/2019 0231   UROBILINOGEN neg 09/25/2010 1503   NITRITE NEGATIVE 08/16/2019 0231   LEUKOCYTESUR NEGATIVE 08/16/2019 0231   Sepsis Labs: @LABRCNTIP (procalcitonin:4,lacticidven:4)  ) Recent Results (from the past 240 hour(s))  Respiratory Panel by RT PCR (Flu A&B, Covid) - Nasopharyngeal Swab      Status: None   Collection Time: 08/30/2019 10:16 PM   Specimen: Nasopharyngeal Swab  Result Value Ref Range Status   SARS Coronavirus 2 by RT PCR NEGATIVE NEGATIVE Final    Comment: (NOTE) SARS-CoV-2 target nucleic acids are NOT DETECTED. The SARS-CoV-2 RNA is generally detectable in upper respiratoy specimens during the acute phase of infection. The lowest concentration of SARS-CoV-2 viral copies this assay can detect is 131 copies/mL. A negative result does not preclude SARS-Cov-2 infection and should not be used as the sole basis for treatment or other patient management decisions. A negative result may occur with  improper specimen collection/handling,  submission of specimen other than nasopharyngeal swab, presence of viral mutation(s) within the areas targeted by this assay, and inadequate number of viral copies (<131 copies/mL). A negative result must be combined with clinical observations, patient history, and epidemiological information. The expected result is Negative. Fact Sheet for Patients:  https://www.moore.com/ Fact Sheet for Healthcare Providers:  https://www.young.biz/ This test is not yet ap proved or cleared by the [place] FDA and  has been authorized for detection and/or diagnosis of SARS-CoV-2 by FDA under an Emergency Use Authorization (EUA). This EUA will remain  in effect (meaning this test can be used) for the duration of the COVID-19 declaration under Section 564(b)(1) of the Act, 21 U.S.C. section 360bbb-3(b)(1), unless the authorization is terminated or revoked sooner.    Influenza A by PCR NEGATIVE NEGATIVE Final   Influenza B by PCR NEGATIVE NEGATIVE Final    Comment: (NOTE) The Xpert Xpress SARS-CoV-2/FLU/RSV assay is intended as an aid in  the diagnosis of influenza from Nasopharyngeal swab specimens and  should not be used as a sole basis for treatment. Nasal washings and  aspirates are unacceptable for  Xpert Xpress SARS-CoV-2/FLU/RSV  testing. Fact Sheet for Patients: https://www.moore.com/ Fact Sheet for Healthcare Providers: https://www.young.biz/ This test is not yet approved or cleared by the [place] FDA and  has been authorized for detection and/or diagnosis of SARS-CoV-2 by  FDA under an Emergency Use Authorization (EUA). This EUA will remain  in effect (meaning this test can be used) for the duration of the  Covid-19 declaration under Section 564(b)(1) of the Act, 21  U.S.C. section 360bbb-3(b)(1), unless the authorization is  terminated or revoked. Performed at [facility], [address] [place], [place] [address]   Culture, Urine     Status: Abnormal   Collection Time: 08/16/19  2:25 AM   Specimen: Urine, Clean Catch  Result Value Ref Range Status   Specimen Description URINE, CLEAN CATCH  Final   Special Requests NONE  Final   Culture (A)  Final    <10,000 COLONIES/mL INSIGNIFICANT GROWTH Performed at [facility], [address] [place], [place] [address]    Report Status 08/16/2019 FINAL  Final  SARS CORONAVIRUS 2 (TAT 6-24 HRS) Nasopharyngeal Nasopharyngeal Swab     Status: None   Collection Time: 08/16/19  3:13 PM   Specimen: Nasopharyngeal Swab  Result Value Ref Range Status   SARS Coronavirus 2 NEGATIVE NEGATIVE Final    Comment: (NOTE) SARS-CoV-2 target nucleic acids are NOT DETECTED. The SARS-CoV-2 RNA is generally detectable in upper and lower respiratory specimens during the acute phase of infection. Negative results do not preclude SARS-CoV-2 infection, do not rule out co-infections with other pathogens, and should not be used as the sole basis for treatment or other patient management decisions. Negative results must be combined with clinical observations, patient history, and epidemiological information. The expected result is Negative. Fact Sheet for  Patients: HairSlick.no Fact Sheet for Healthcare Providers: quierodirigir.com This test is not yet approved or cleared by the [place] FDA and  has been authorized for detection and/or diagnosis of SARS-CoV-2 by FDA under an Emergency Use Authorization (EUA). This EUA will remain  in effect (meaning this test can be used) for the duration of the COVID-19 declaration under Section 56 4(b)(1) of the Act, 21 U.S.C. section 360bbb-3(b)(1), unless the authorization is terminated or revoked sooner. Performed at [facility], [address], [place], [place] [address]  Studies: No results found.  Scheduled Meds: . folic acid  1 mg Oral Daily  . hydrALAZINE  10 mg Oral Q6H  . levETIRAcetam  500 mg Oral BID  . multivitamin with minerals  1 tablet Oral Daily  . pantoprazole  40 mg Oral QHS  . senna-docusate  1 tablet Oral BID  . thiamine  100 mg Oral Daily   Or  . thiamine  100 mg Intravenous Daily    Continuous Infusions:   LOS: 7 days     Darlin Drop, MD [facility] Pager [phone]  If 7PM-7AM, please contact night-coverage [email or web address] Password [facility] 09/16/19, 9:26 AM

## 2019-08-17 NOTE — Progress Notes (Signed)
eLink Physician-Brief Progress Note Patient Name: Olivia Harris DOB: 03-06-1951 MRN: [id number]   Date of Service  09/01/2019  HPI/Events of Note  83 F initially admitted 5/6 with acute SDH but was discharged to [facility] 5/13 after a stable head CT but brought back in due to altered sensorium and CT revealed large acute left SDH with uncal herniation. Intubated. Neurosurgery has seen patient in the ED and deemed not a surgical candidate due to futility  eICU Interventions  Goals of care will need to be discussed today     Intervention Category Major Interventions: Respiratory failure - evaluation and management;Hemorrhage - evaluation and management;Change in mental status - evaluation and management Evaluation Type: New Patient Evaluation  Darl Pikes 08/15/2019, 11:21 PM

## 2019-08-17 NOTE — Progress Notes (Signed)
CDS referral initiated. The referral # [id number]

## 2019-08-17 NOTE — ED Notes (Signed)
Dr. Lovell Sheehan in to assess pt at this time

## 2019-08-17 NOTE — ED Notes (Signed)
Propofol paused per Neurosurgery's request

## 2019-08-17 NOTE — ED Provider Notes (Signed)
[facility] EMERGENCY DEPARTMENT Provider Note   CSN: [id number] Arrival date & time: 08/16/2019  1947  An emergency department physician performed an initial assessment on this suspected stroke patient at 46.  History No chief complaint on file.   Olivia Harris is a 69 y.o. female.  HPI Patient is a 69 year old female who presents after being found unresponsive at nursing home with right-sided facial droop.  Last known normal was 7 PM (1 hour prior to arrival).  Patient was discharged today after hospitalization for subdural hematoma.  At time of discharge, she was noted to be pleasantly confused. She is not currently on any blood thinner medication.  EMS reports continued unresponsiveness during transit.  Vital signs notable for hypertension.  No interventions were given by EMS.    Past Medical History:  Diagnosis Date  . ADHD (attention deficit hyperactivity disorder) Dr.Steiner  . Anxiety Dr.Steiner  . Arthritis   . Decreased libido   . Depression Dr.Steiner  . Essential hypertension, benign  07/29/2012  . Onychomycosis   . Postmenopausal   . Scoliosis   . Scoliosis    degenerative   . Seborrheic dermatitis     Patient Active Problem List   Diagnosis Date Noted  . Respiratory failure with hypoxia ([place])   . Acute encephalopathy   . Alcohol abuse 08/11/2019  . Hyponatremia 08/11/2019  . Subdural hematoma ([place]) 08/09/2019  . Major depressive disorder, single episode, moderate degree ([place]) 12/04/2016  . Osteopenia, last dexa 07/2012, Ca Vit D Exercise, repeat 2 years 07/29/2012  . Hypertension 07/29/2012  . Vaginal bleeding - followed by Dr. Briant Sites ob/gyn 07/29/2012    Past Surgical History:  Procedure Laterality Date  . arthroscopic surgery shoulder  right, 1994(Dr.Rowan) bone spur removed     OB History    Gravida  2   Para  1   Term      Preterm      AB      Living        SAB      TAB      Ectopic      Multiple     Live Births              Family History  Problem Relation Age of Onset  . Thyroid disease Mother   . Arthritis Mother   . Heart disease Mother        CHF  . Hypertension Brother   . Arthritis Sister   . Arthritis Other        grandmother  . Hyperlipidemia Sister   . Heart disease Other        grandmother  . Stroke Other        grandfather  . Hypertension Brother     Social History   Tobacco Use  . Smoking status: Former Smoker    Packs/day: 0.20    Types: Cigarettes  . Smokeless tobacco: Never Used  Substance Use Topics  . Alcohol use: Yes  . Drug use: No    Home Medications Prior to Admission medications   Medication Sig Start Date End Date Taking? Authorizing Provider  folic acid (FOLVITE) 1 MG tablet Take 1 tablet (1 mg total) by mouth daily. 08/31/2019 11/15/19  Kayleen Memos, DO  hydrALAZINE (APRESOLINE) 10 MG tablet Take 1 tablet (10 mg total) by mouth 4 (four) times daily. 08/16/19 11/14/19  Kayleen Memos, DO  levETIRAcetam (KEPPRA) 500 MG tablet Take 1 tablet (500 mg total)  by mouth 2 (two) times daily for 1 day. 08/16/19 09/01/2019  Kayleen Memos, DO  Multiple Vitamin (MULTIVITAMIN WITH MINERALS) TABS tablet Take 1 tablet by mouth daily. 08/31/2019 11/15/19  Kayleen Memos, DO  pantoprazole (PROTONIX) 40 MG tablet Take 1 tablet (40 mg total) by mouth at bedtime. 08/16/19 11/14/19  Kayleen Memos, DO  thiamine 100 MG tablet Take 1 tablet (100 mg total) by mouth daily. 08/23/2019   Kayleen Memos, DO    Allergies    Antihistamines, chlorpheniramine-type  Review of Systems   Review of Systems  Unable to perform ROS: Mental status change    Physical Exam Updated Vital Signs BP (!) 156/118   Pulse 84   Temp 99 F (37.2 C)   Resp 17   Ht _0  (1.651 m)   SpO2 98%   BMI 24.96 kg/m   Physical Exam Constitutional:      Appearance: She is ill-appearing. She is not toxic-appearing or diaphoretic.  HENT:     Head: Atraumatic.     Right Ear: External ear  normal.     Left Ear: External ear normal.     Nose: Nose normal.     Mouth/Throat:     Mouth: Mucous membranes are moist.     Pharynx: Oropharynx is clear.  Eyes:     General: No scleral icterus.       Right eye: No discharge.        Left eye: No discharge.     Comments: Pupils 5 mm bilaterally and nonreactive to light.  Cardiovascular:     Rate and Rhythm: Normal rate. Rhythm irregular.     Pulses: Normal pulses.  Pulmonary:     Effort: No respiratory distress.     Breath sounds: No wheezing.  Abdominal:     General: There is no distension.     Palpations: Abdomen is soft.  Musculoskeletal:        General: No deformity.     Cervical back: Neck supple.  Skin:    General: Skin is warm and dry.  Neurological:     GCS: GCS eye subscore is 1. GCS verbal subscore is 1. GCS motor subscore is 1.     ED Results / Procedures / Treatments   Labs (all labs ordered are listed, but only abnormal results are displayed) Labs Reviewed  CBC - Abnormal; Notable for the following components:      Result Value   MCH 34.7 (*)    MCHC 36.1 (*)    All other components within normal limits  DIFFERENTIAL - Abnormal; Notable for the following components:   Monocytes Absolute 1.4 (*)    All other components within normal limits  COMPREHENSIVE METABOLIC PANEL - Abnormal; Notable for the following components:   Sodium 127 (*)    Potassium 3.4 (*)    Chloride 93 (*)    CO2 19 (*)    Glucose, Bld 179 (*)    BUN 5 (*)    Calcium 8.6 (*)    Total Protein 6.3 (*)    ALT 54 (*)    Total Bilirubin 1.5 (*)    All other components within normal limits  URINALYSIS, ROUTINE W REFLEX MICROSCOPIC - Abnormal; Notable for the following components:   Glucose, UA 50 (*)    Ketones, ur 20 (*)    All other components within normal limits  I-STAT CHEM 8, ED - Abnormal; Notable for the following components:   Sodium 127 (*)  Potassium 3.4 (*)    Chloride 93 (*)    BUN 3 (*)    Creatinine, Ser 0.30  (*)    Glucose, Bld 175 (*)    Calcium, Ion 1.02 (*)    TCO2 21 (*)    All other components within normal limits  CBG MONITORING, ED - Abnormal; Notable for the following components:   Glucose-Capillary 164 (*)    All other components within normal limits  POCT I-STAT 7, (LYTES, BLD GAS, ICA,H+H) - Abnormal; Notable for the following components:   pO2, Arterial 175 (*)    Sodium 125 (*)    Potassium 3.2 (*)    All other components within normal limits  SARS CORONAVIRUS 2 BY RT PCR (HOSPITAL ORDER, [facility])  MRSA PCR SCREENING  ETHANOL  PROTIME-INR  APTT  RAPID URINE DRUG SCREEN, HOSP PERFORMED  BLOOD GAS, ARTERIAL  HIV ANTIBODY (ROUTINE TESTING W REFLEX)    EKG None  Radiology DG Abdomen 1 View  Result Date: 08/16/2019 CLINICAL DATA:  OG tube placement EXAM: ABDOMEN - 1 VIEW COMPARISON:  None. FINDINGS: OG tube is in the stomach. Nonobstructive bowel gas pattern. Left lower lobe atelectasis or consolidation with small left effusion. IMPRESSION: OG tube tip in the stomach. Electronically Signed   By: Rolm Baptise M.D.   On: 08/13/2019 21:15   DG Chest Portable 1 View  Result Date: 08/23/2019 CLINICAL DATA:  OG tube placement, endotracheal tube placement EXAM: PORTABLE CHEST 1 VIEW COMPARISON:  08/05/2019 FINDINGS: Endotracheal tube is 6 cm above the carina. OG tube is in the stomach. Left lower atelectasis or consolidation, worsening since prior study. Small left pleural effusion. Right base atelectasis. Heart is normal size. No acute bony abnormality. IMPRESSION: Support devices in expected position as above. Worsening left lower lobe atelectasis or consolidation. Small left effusion. Right base atelectasis. Electronically Signed   By: Rolm Baptise M.D.   On: 08/11/2019 21:15   DG Chest Portable 1 View  Result Date: 08/31/2019 CLINICAL DATA:  Endotracheal tube placement. EXAM: PORTABLE CHEST 1 VIEW COMPARISON:  None. FINDINGS: The heart size and  mediastinal contours are within normal limits. Endotracheal tube is in good position. No pneumothorax is noted. Right lung is clear. Minimal left basilar atelectasis is noted with possible small left pleural effusion. The visualized skeletal structures are unremarkable. IMPRESSION: Endotracheal tube in good position. Minimal left basilar atelectasis is noted with possible small left pleural effusion. Electronically Signed   By: Marijo Conception M.D.   On: 08/23/2019 20:14   CT HEAD CODE STROKE WO CONTRAST  Result Date: 08/20/2019 CLINICAL DATA:  Code stroke. Initial evaluation for acute headache, code stroke. EXAM: CT HEAD WITHOUT CONTRAST TECHNIQUE: Contiguous axial images were obtained from the base of the skull through the vertex without intravenous contrast. COMPARISON:  Prior head CT from 08/11/2019. FINDINGS: Brain: Previously identified holo hemispheric left subdural hematoma is increased in size, now measuring up to 2.7 cm in maximal thickness. Small amount of subdural blood extends along the falx and tentorium. Associated mass effect on the subjacent left cerebral hemisphere with up to 2 cm of left-to-right shift, markedly increased from previous. Partial effacement of the left lateral ventricles with evidence of ventricular trapping and transependymal flow of CSF about the occipital horn of the right lateral ventricle. Additionally, scattered small volume subarachnoid hemorrhage seen about the left sylvian fissure, new from previous. No acute large vessel territory infarct. No mass lesion. Underlying age-related cerebral atrophy with  chronic small vessel ischemic disease. Vascular: No visible hyperdense vessel. Skull: Scalp soft tissues and calvarium within normal limits. Sinuses/Orbits: Globes and orbital soft tissues within normal limits. Scattered mucosal thickening noted within the ethmoidal air cells and left sphenoid sinus. Paranasal sinuses are otherwise clear. No mastoid effusion. Other: None.  IMPRESSION: 1. Interval increase in size of holo hemispheric left subdural hematoma now measuring up to 2.7 cm in maximal thickness, with markedly worsened 2 cm left-to-right midline shift. Evidence for ventricular trapping and transependymal flow of CSF at the right lateral ventricle. Emergent neuro surgical consultation recommended. 2. Associated small volume subarachnoid hemorrhage centered about the left sylvian fissure. 3. Aspects: Does not apply given acute intracranial hemorrhage. These results were communicated to Dr. Lorraine Lax at 8:36 pmon 05/10/2021by text page via the [facility] messaging system. Critical Value/emergent results were also discussed by telephone at the time of interpretation on 09/01/2019 at 8:41 pm with Dr. Lorraine Lax. Electronically Signed   By: Jeannine Boga M.D.   On: 08/08/2019 20:38    Procedures Procedure Name: Intubation Date/Time: 08/31/2019 8:00 PM Performed by: Godfrey Pick, MD Pre-anesthesia Checklist: Suction available, Patient being monitored and Emergency Drugs available Oxygen Delivery Method: Ambu bag Preoxygenation: Pre-oxygenation with 100% oxygen Induction Type: Rapid sequence Ventilation: Mask ventilation without difficulty Laryngoscope Size: Mac and 4 Grade View: Grade I Tube size: 7.5 mm Number of attempts: 1 Airway Equipment and Method: Rigid stylet Placement Confirmation: ETT inserted through vocal cords under direct vision,  Positive ETCO2,  CO2 detector and Breath sounds checked- equal and bilateral Secured at: 24 cm Tube secured with: ETT holder Dental Injury: Teeth and Oropharynx as per pre-operative assessment  Difficulty Due To: Difficulty was unanticipated Future Recommendations: Recommend- induction with short-acting agent, and alternative techniques readily available      (including critical care time)  Medications Ordered in ED Medications  propofol (DIPRIVAN) 1000 MG/100ML infusion (has no administration in time range)  ondansetron  (ZOFRAN) injection 4 mg (has no administration in time range)  0.9 % NaCl with KCl 20 mEq/ L  infusion ( Intravenous New Bag/Given 08/16/2019 2246)  levETIRAcetam (KEPPRA) IVPB 500 mg/100 mL premix (500 mg Intravenous New Bag/Given 08/18/19 0001)  morphine 2 MG/ML injection 2-4 mg (has no administration in time range)  levETIRAcetam (KEPPRA) IVPB 500 mg/100 mL premix (has no administration in time range)  thiamine (B-1) injection 100 mg (has no administration in time range)  folic acid injection 1 mg (has no administration in time range)  fentaNYL (SUBLIMAZE) injection 25 mcg (has no administration in time range)  fentaNYL (SUBLIMAZE) injection 25-100 mcg (has no administration in time range)  midazolam (VERSED) injection 1 mg (has no administration in time range)  midazolam (VERSED) injection 1 mg (has no administration in time range)  pantoprazole (PROTONIX) injection 40 mg (has no administration in time range)  clevidipine (CLEVIPREX) infusion 0.5 mg/mL (2 mg/hr Intravenous New Bag/Given 08/15/2019 2301)  chlorhexidine gluconate (MEDLINE KIT) (PERIDEX) 0.12 % solution 15 mL (has no administration in time range)  MEDLINE mouth rinse (has no administration in time range)  Chlorhexidine Gluconate Cloth 2 % PADS 6 each (6 each Topical Given 08/12/2019 2245)  docusate (COLACE) 50 MG/5ML liquid 100 mg (100 mg Per Tube Given 08/14/2019 2256)  polyethylene glycol (MIRALAX / GLYCOLAX) packet 17 g (has no administration in time range)  mannitol 20 % IVPB 50 g (0 g Intravenous Stopped 08/16/2019 2057)  etomidate (AMIDATE) injection (20 mg Intravenous Given 08/30/2019 1956)  calcium gluconate 1 g/ 50  mL sodium chloride IVPB (1,000 mg Intravenous New Bag/Given 08/08/2019 2252)  potassium chloride 20 MEQ/15ML (10%) solution 40 mEq (40 mEq Per Tube Given 08/13/2019 2253)    ED Course  I have reviewed the triage vital signs and the nursing notes.  Pertinent labs & imaging results that were available during my care of the  patient were reviewed by me and considered in my medical decision making (see chart for details).    MDM Rules/Calculators/A&P                      Patient is a 69 year old female who presents after sudden onset of unresponsiveness at nursing facility.  Recent history is as follows:  Patient presented to ED on 5/6, confused and requesting anxiety medication.  A friend who was with her reported 3 days of confusion.  CT scan at that time showed SDH along left frontal and parietal convexities with 4 mm midline shift.  She was transferred to [facility] for admission.  Repeat scan showed stability of SDH.  She was discharged to nursing facility earlier today with plan for neurosurgery follow-up.  Patient was observed to be normal at facility at 7 PM.  Shortly thereafter, she was unresponsive.  Patient arrives in the ED, today, as a code stroke.  Upon arrival, she is a GCS of 3.  She was intubated for airway protection.  Propofol was started for post intubation sedation.  Of note, she had presence of gag reflex prior to intubation.  She was taken to CT scanner.  CT scan showed increased subdural hematoma, 2.7 cm in maximal thickness, with 2 cm left to right midline shift and evidence of herniation.  Neurosurgery was consulted. Patient was admitted to neurosurgery service.  Final Clinical Impression(s) / ED Diagnoses Final diagnoses:  SDH (subdural hematoma) (HCC)  Glasgow coma scale total score 3-8, in the field (EMT or ambulance) [facility])    Rx / DC Orders ED Discharge Orders    None       Godfrey Pick, MD 08/18/19 5701    Malvin Johns, MD Sep 18, 2019 1800

## 2019-08-17 NOTE — Care Management Important Message (Signed)
Important Message  Patient Details  Name: Olivia Harris MRN: [id number] Date of Birth: 12/20/1950   Medicare Important Message Given:  Yes     Dorena Bodo 08/23/2019, 12:13 PM

## 2019-08-17 NOTE — Discharge Summary (Signed)
Discharge Summary  Olivia Harris KGY:[id number] DOB: [birth year]  PCP: Patient, No Pcp Per  Admit date: 09-03-19 Discharge date: 08/13/2019  Time spent: 35 minutes  Recommendations for Outpatient Follow-up:  1. Follow up with neurosurgery in 1-2 weeks 2. Follow-up with psychiatry in 1 to 2 weeks 3. Follow up with your PCP in 1 week 4. Take your medications as prescribed 5. Continue PT OT with assistance and fall precautions.  Discharge Diagnoses:  Active Hospital Problems   Diagnosis Date Noted  . Subdural hematoma (HCC) 03-Sep-2019  . Alcohol abuse 08/11/2019  . Hyponatremia 08/11/2019  . Major depressive disorder, single episode, moderate degree (HCC) 12/04/2016    Resolved Hospital Problems  No resolved problems to display.    Discharge Condition: Stable  Diet recommendation: Resume previous diet.  Vitals:   08/18/2019 0744 09/03/2019 1137  BP: 129/69 140/78  Pulse: 75 80  Resp: 16 16  Temp: 99.4 F (37.4 C) 98.9 F (37.2 C)  SpO2: 95% 98%    History of present illness:  69 year old female with a history of alcohol abuse, hypertension, depression, anxiety, ADHD who came to ED with complaints of anxiety. Patient was found to be confused in the ED. She underwent CT head which showed crescentic hyperdense subdural hemorrhage extending across the cerebral hemispheres most pronounced over the left frontal and temporal lobes along the falx and left tentorium. Mild mass-effect with global sulcal effacement and left right midline shift of 4 mm. Neurosurgery saw the patient and recommended no invasive intervention. Patient also was placed under IVC letter due to suicidal ideation.Seen by psych.She denies current suicidal ideation. Recommended outpatient follow-up.  08/16/2019: Seen and examined.  More alert and interactive.  Pleasantly confused.  TOC working on placement.   Hospital Course:  Principal Problem:   Subdural hematoma (HCC) Active Problems:   Major  depressive disorder, single episode, moderate degree (HCC)   Alcohol abuse   Hyponatremia  Subdural hematoma,nonoperative per neurosurgery- Seen by neurosurgery. No invasive intervention planned. No reported seizures activity Complete seizures prophylaxisKeppra500 mg twice daily x 7 days, day# 7/7.  Follow up with neurosurgeryDr. Cabbelloutpatient.  Alcohol abuse/depression/suicidal attempt- Patient was IVC by ER physician. Seen by psych; outpatient follow up recommended.  Continue p.o.MV, thiamine, folic acid.  Resolving hypovolemic Hyponatremia Serum sodium 131. Encourage increase oral intake Avoid use of alcohol  Transaminitis- Presented with elevated LFTs in setting of alcohol use. AST,ALTand T bilihave improved and continue to trend down.  Resolved post repletion:Hypokalemia Serum potassium 3.5 Follow-up with your PCP  Ambulatory dysfunction In the setting of acute CVA PT OT recs SNF.  TOC assisting with placement.   Code Status:Full code   Consult: Neurosurgery, psych.      Discharge Exam: BP 140/78 (BP Location: Right Arm)   Pulse 80   Temp 98.9 F (37.2 C)   Resp 16   SpO2 98%  . General: 69 y.o. year-old female well developed well nourished in no acute distress.  Alert and cooperative. . Cardiovascular: Regular rate and rhythm with no rubs or gallops.  No thyromegaly or JVD noted.   [name] Respiratory: Clear to auscultation with no wheezes or rales. Good inspiratory effort. . Abdomen: Soft nontender nondistended with normal bowel sounds x4 quadrants. . Musculoskeletal: No lower extremity edema. 2/4 pulses in all 4 extremities. [name] Psychiatry: Mood is appropriate for condition and setting  Discharge Instructions You were cared for by a hospitalist during your hospital stay. If you have any questions about your discharge medications or the  care you received while you were in the hospital after you are discharged, you can call  the unit and asked to speak with the hospitalist on call if the hospitalist that took care of you is not available. Once you are discharged, your primary care physician will handle any further medical issues. Please note that NO REFILLS for any discharge medications will be authorized once you are discharged, as it is imperative that you return to your primary care physician (or establish a relationship with a primary care physician if you do not have one) for your aftercare needs so that they can reassess your need for medications and monitor your lab values.   Allergies as of 09-09-2019      Reactions   Antihistamines, Chlorpheniramine-type Other (See Comments)   hyperactivity      Medication List    STOP taking these medications   lisinopril 5 MG tablet Commonly known as: ZESTRIL     TAKE these medications   folic acid 1 MG tablet Commonly known as: FOLVITE Take 1 tablet (1 mg total) by mouth daily.   hydrALAZINE 10 MG tablet Commonly known as: APRESOLINE Take 1 tablet (10 mg total) by mouth 4 (four) times daily.   levETIRAcetam 500 MG tablet Commonly known as: KEPPRA Take 1 tablet (500 mg total) by mouth 2 (two) times daily for 1 day.   multivitamin with minerals Tabs tablet Take 1 tablet by mouth daily.   pantoprazole 40 MG tablet Commonly known as: PROTONIX Take 1 tablet (40 mg total) by mouth at bedtime.   thiamine 100 MG tablet Take 1 tablet (100 mg total) by mouth daily.     ASK your doctor about these medications    stroke: mapping our early stages of recovery book Misc 1 each by Does not apply route once for 1 dose. Ask about: Should I take this medication?      Allergies  Allergen Reactions  . Antihistamines, Chlorpheniramine-Type Other (See Comments)    hyperactivity   Follow-up Information    [facility]. Call in 1 day(s).   Why: Call for a post hospital follow-up appointment. Contact information: [address] [place] [address] [phone]       Micki Riley, MD. Call in 1 day(s).   Specialties: Neurology, Radiology Why: Please call for a post hospital follow-up appointment. Contact information: [address] [place] [address] [phone]        Coletta Memos, MD. Call in 1 day(s).   Specialty: Neurosurgery Why: Please call for a post hospital follow-up appointment. Contact information: [address] [place] [address] [phone]        Maryagnes Amos, FNP. Call in 1 day(s).   Specialties: Nurse Practitioner, Psychiatry Why: Please call for a post hospital follow-up appointment. Contact information: [address] [place] [address] [phone]            The results of significant diagnostics from this hospitalization (including imaging, microbiology, ancillary and laboratory) are listed below for reference.    Significant Diagnostic Studies: CT HEAD WO CONTRAST  Result Date: 08/11/2019 CLINICAL DATA:  Stroke follow-up EXAM: CT HEAD WITHOUT CONTRAST TECHNIQUE: Contiguous axial images were obtained from the base of the skull through the vertex without intravenous contrast. COMPARISON:  Yesterday FINDINGS: Brain: High-density subdural hematoma along the left cerebral convexity measuring up to 10 mm in thickness. There is cortical mass effect and sulcal  effacement-midline shift is unchanged at 4 mm. Negative for infarct or hydrocephalus. Mild white matter disease and volume loss. Vascular: Atherosclerotic calcification Skull: Normal. Negative for fracture or focal lesion. Sinuses/Orbits: No acute finding. IMPRESSION: Unchanged subdural hematoma on the left measuring up to 1 cm in thickness. Midline shift is 4 mm. Electronically Signed   By: Monte Fantasia M.D.   On: 08/11/2019 16:01   CT Head Wo Contrast  Result Date: 09/03/2019 CLINICAL DATA:  Confusion, altered mental status EXAM: CT  HEAD WITHOUT CONTRAST TECHNIQUE: Contiguous axial images were obtained from the base of the skull through the vertex without intravenous contrast. COMPARISON:  None. FINDINGS: Brain: Crescentic, hyperdense subdural hemorrhage seen extending across the left frontal and parietal convexities with additional hyperdense hemorrhage seen layering along the left tentorium and along the left falx as well. There is fairly global sulcal effacement of the left cerebral hemisphere with some a minimal left-to-right midline shift approximately 4 mm best seen on coronal imaging 5/22. No other sites of hemorrhage. No CT evidence of large vascular territory infarct. Patchy areas of white matter hypoattenuation are most compatible with chronic microvascular angiopathy. Symmetric prominence of the ventricles, cisterns and sulci compatible with parenchymal volume loss. Vascular: No hyperdense vessel. Skull: No calvarial fracture or suspicious osseous lesion. No scalp swelling or hematoma. Sinuses/Orbits: Paranasal sinuses and mastoid air cells are predominantly clear. Pneumatization of the petrous apices. Included orbital structures are unremarkable. Other: None IMPRESSION: 1. Crescentic, hyperdense subdural hemorrhage extending across the cerebral hemisphere most pronounced over the left frontal and temporal lobes, along the falx and left tentorium. 2. Mild mass effect with global sulcal effacement and left-to-right midline shift of 4 mm. 3. No CT evidence of large vascular territory infarct. 4. Chronic microvascular angiopathy and parenchymal volume loss. Critical Value/emergent results were called by telephone at the time of interpretation on 08/09/2019 at 9:55 pm to provider [facility] , who verbally acknowledged these results. Electronically Signed   By: Lovena Le M.D.   On: 08/11/2019 21:55    Microbiology: Recent Results (from the past 240 hour(s))  Respiratory Panel by RT PCR (Flu A&B, Covid) - Nasopharyngeal Swab      Status: None   Collection Time: 08/12/2019 10:16 PM   Specimen: Nasopharyngeal Swab  Result Value Ref Range Status   SARS Coronavirus 2 by RT PCR NEGATIVE NEGATIVE Final    Comment: (NOTE) SARS-CoV-2 target nucleic acids are NOT DETECTED. The SARS-CoV-2 RNA is generally detectable in upper respiratoy specimens during the acute phase of infection. The lowest concentration of SARS-CoV-2 viral copies this assay can detect is 131 copies/mL. A negative result does not preclude SARS-Cov-2 infection and should not be used as the sole basis for treatment or other patient management decisions. A negative result may occur with  improper specimen collection/handling, submission of specimen other than nasopharyngeal swab, presence of viral mutation(s) within the areas targeted by this assay, and inadequate number of viral copies (<131 copies/mL). A negative result must be combined with clinical observations, patient history, and epidemiological information. The expected result is Negative. Fact Sheet for Patients:  PinkCheek.be Fact Sheet for Healthcare Providers:  GravelBags.it This test is not yet ap proved or cleared by the [place] FDA and  has been authorized for detection and/or diagnosis of SARS-CoV-2 by FDA under an Emergency Use Authorization (EUA). This EUA will remain  in effect (meaning this test can be used) for the duration of the COVID-19 declaration under Section 564(b)(1) of the Act, 21  U.S.C. section 360bbb-3(b)(1), unless the authorization is terminated or revoked sooner.    Influenza A by PCR NEGATIVE NEGATIVE Final   Influenza B by PCR NEGATIVE NEGATIVE Final    Comment: (NOTE) The Xpert Xpress SARS-CoV-2/FLU/RSV assay is intended as an aid in  the diagnosis of influenza from Nasopharyngeal swab specimens and  should not be used as a sole basis for treatment. Nasal washings and  aspirates are unacceptable for  Xpert Xpress SARS-CoV-2/FLU/RSV  testing. Fact Sheet for Patients: https://www.moore.com/https://www.fda.gov/media/142436/download Fact Sheet for Healthcare Providers: https://www.young.biz/https://www.fda.gov/media/142435/download This test is not yet approved or cleared by the [place] FDA and  has been authorized for detection and/or diagnosis of SARS-CoV-2 by  FDA under an Emergency Use Authorization (EUA). This EUA will remain  in effect (meaning this test can be used) for the duration of the  Covid-19 declaration under Section 564(b)(1) of the Act, 21  U.S.C. section 360bbb-3(b)(1), unless the authorization is  terminated or revoked. Performed at [facility], [address] [place], [place] [address]   Culture, Urine     Status: Abnormal   Collection Time: 08/16/19  2:25 AM   Specimen: Urine, Clean Catch  Result Value Ref Range Status   Specimen Description URINE, CLEAN CATCH  Final   Special Requests NONE  Final   Culture (A)  Final    <10,000 COLONIES/mL INSIGNIFICANT GROWTH Performed at [facility], [address] [place], [place] [address]    Report Status 08/16/2019 FINAL  Final  SARS CORONAVIRUS 2 (TAT 6-24 HRS) Nasopharyngeal Nasopharyngeal Swab     Status: None   Collection Time: 08/16/19  3:13 PM   Specimen: Nasopharyngeal Swab  Result Value Ref Range Status   SARS Coronavirus 2 NEGATIVE NEGATIVE Final    Comment: (NOTE) SARS-CoV-2 target nucleic acids are NOT DETECTED. The SARS-CoV-2 RNA is generally detectable in upper and lower respiratory specimens during the acute phase of infection. Negative results do not preclude SARS-CoV-2 infection, do not rule out co-infections with other pathogens, and should not be used as the sole basis for treatment or other patient management decisions. Negative results must be combined with clinical observations, patient history, and epidemiological information. The expected result is Negative. Fact Sheet for  Patients: HairSlick.nohttps://www.fda.gov/media/138098/download Fact Sheet for Healthcare Providers: quierodirigir.comhttps://www.fda.gov/media/138095/download This test is not yet approved or cleared by the [place] FDA and  has been authorized for detection and/or diagnosis of SARS-CoV-2 by FDA under an Emergency Use Authorization (EUA). This EUA will remain  in effect (meaning this test can be used) for the duration of the COVID-19 declaration under Section 56 4(b)(1) of the Act, 21 U.S.C. section 360bbb-3(b)(1), unless the authorization is terminated or revoked sooner. Performed at [facility], [address] [place], [place] [address]      Labs: Basic Metabolic Panel: Recent Labs  Lab 08/11/19 0516 08/11/19 0516 08/11/19 1627 08/12/19 0354 08/13/19 0456 08/15/19 0649 08/16/19 0737  NA 129*  --  128* 128* 129* 131*  --   K 3.0*   < > 3.5 4.6 4.0 3.3* 3.5  CL 94*  --  93* 94* 96* 94*  --   CO2 21*  --  21* 19* 21* 26  --   GLUCOSE 105*  --  99 81 87 92  --   BUN 5*  --  7* 6* 5* <5*  --   CREATININE 0.47  --  0.61 0.54 0.55 0.50  --   CALCIUM 8.2*  --  8.7* 8.5* 8.8* 8.8*  --   MG  --   --  1.7  --   --   --   --   PHOS  --   --  2.4*  --   --   --   --    < > = values in this interval not displayed.   Liver Function Tests: Recent Labs  Lab 08/13/2019 2216 08/13/19 0456 08/15/19 0649  AST 83* 36 40  ALT 173* 76* 62*  ALKPHOS 72 57 62  BILITOT 2.1* 1.5* 1.3*  PROT 7.1 5.7* 5.6*  ALBUMIN 4.4 3.1* 3.1*   Recent Labs  Lab 08/30/2019 2216  LIPASE 27   Recent Labs  Lab 08/19/2019 2216  AMMONIA 25   CBC: Recent Labs  Lab 08/23/2019 2216 08/15/19 0649  WBC 9.1 4.0  NEUTROABS 7.4 2.3  HGB 15.1* 12.4  HCT 41.1 34.8*  MCV 94.7 95.3  PLT 165 214   Cardiac Enzymes: No results for input(s): CKTOTAL, CKMB, CKMBINDEX, TROPONINI in the last 168 hours. BNP: BNP (last 3 results) No results for input(s): BNP in the last 8760 hours.  ProBNP (last 3 results) No results for  input(s): PROBNP in the last 8760 hours.  CBG: No results for input(s): GLUCAP in the last 168 hours.     Signed:  Darlin Drop, MD [facility] 2019/09/16, 1:24 PM

## 2019-08-17 NOTE — Progress Notes (Signed)
RT NOTE:  Pt transported to CT and back to ER without event 

## 2019-08-17 NOTE — ED Notes (Signed)
Propofol restarted at 10mcg/\kg/min

## 2019-08-17 NOTE — Progress Notes (Signed)
Called [facility] to give the nurse report on this patient. Nurse unavailable at this time. Left my name and callback number with the receptionist. Awaiting a callback at this time.

## 2019-08-18 ENCOUNTER — Inpatient Hospital Stay (HOSPITAL_COMMUNITY): Payer: Medicare Other

## 2019-08-18 DIAGNOSIS — J9691 Respiratory failure, unspecified with hypoxia: Secondary | ICD-10-CM

## 2019-08-18 DIAGNOSIS — J9621 Acute and chronic respiratory failure with hypoxia: Secondary | ICD-10-CM

## 2019-08-18 DIAGNOSIS — G9340 Encephalopathy, unspecified: Secondary | ICD-10-CM

## 2019-08-18 DIAGNOSIS — J9601 Acute respiratory failure with hypoxia: Secondary | ICD-10-CM

## 2019-08-18 DIAGNOSIS — I10 Essential (primary) hypertension: Secondary | ICD-10-CM

## 2019-08-18 LAB — MRSA PCR SCREENING: MRSA by PCR: NEGATIVE

## 2019-08-18 LAB — HIV ANTIBODY (ROUTINE TESTING W REFLEX): HIV Screen 4th Generation wRfx: NONREACTIVE

## 2019-08-18 MED ORDER — POTASSIUM CHLORIDE 20 MEQ/15ML (10%) PO SOLN
40.0000 meq | Freq: Two times a day (BID) | ORAL | Status: AC
  Administered 2019-08-18 (×2): 40 meq
  Filled 2019-08-18: qty 30

## 2019-08-18 MED ORDER — POTASSIUM CHLORIDE 20 MEQ/15ML (10%) PO SOLN
40.0000 meq | Freq: Two times a day (BID) | ORAL | Status: DC
  Filled 2019-08-18: qty 30

## 2019-08-18 NOTE — Progress Notes (Signed)
Olivia Harris (RN SW) got patient's son's phone number that lives in [place]. Olivia Harris [phone]. MD notified and will attempt to contact son at this time.

## 2019-08-18 NOTE — Progress Notes (Signed)
NAME:  Olivia Harris, MRN:  [id number], DOB:  17-Jan-1951, LOS: 1 ADMISSION DATE:  02-Sep-2019, CONSULTATION DATE:  09/02/2019 REFERRING MD:  Lovell Sheehan  CHIEF COMPLAINT:  AMS   Brief History   Olivia Harris is a 69 y.o. female who was admitted with large left SDH with herniation.  Required intubation in ED and PCCM asked to assist with vent management.  History of present illness   Pt is encephelopathic; therefore, this HPI is obtained from chart review. Olivia Harris is a 69 y.o. female who has a PMH as outlined below.  She was initially admitted 08/22/2019 with acute L SDH.  F/u CT 5/7 showed stable SDH.  She was eventually discharged 5/13 to SNF.  While at SNF, she became altered later that evening. She was brought back to ED where CT demonstrated large acute left SDH with uncal herniation and significant MLS.  She was intubated for GCS 3.  Neurosurgery was consulted and is admitting pt while attempting to contact the family.  They have deemed surgical intervention to be futile.  PCCM asked to assist with vent management.  Past Medical History  .has Osteopenia, last dexa 07/2012, Ca Vit D Exercise, repeat 2 years; Hypertension; Vaginal bleeding - followed by Dr. Nikki Dom ob/gyn; Major depressive disorder, single episode, moderate degree (HCC); Subdural hematoma (HCC); Alcohol abuse; Hyponatremia; Respiratory failure with hypoxia (HCC); and Acute encephalopathy on their problem list.  Significant Hospital Events   5/13 > admitted with large left SDH with uncal herniation and MLS.  Consults:  Neuro, PCCM.  Procedures:  ETT 5/13 >   Significant Diagnostic Tests:  CT head 5/13 > large left SDH with 2cm MLS.  Associated small volume SAH.  Micro Data:  COVID 5/13 >  5/12 urine: ng Antimicrobials:  None.   Interim history/subjective:  5/14: cxr with increasing R density, poor neuro exam. Attempted to find NOK 5/13:Unresponsive.  Objective:  Blood pressure (!) 136/92, pulse  (!) 103, temperature 100 F (37.8 C), resp. rate 12, height 5\' 5"  (1.651 m), weight 60.4 kg, SpO2 100 %.    Vent Mode: PRVC FiO2 (%):  [40 %-70 %] 40 % Set Rate:  [12 bmp-15 bmp] 12 bmp Vt Set:  [460 mL] 460 mL PEEP:  [5 cmH20] 5 cmH20 Plateau Pressure:  [12 cmH20-16 cmH20] 12 cmH20   Intake/Output Summary (Last 24 hours) at 08/18/2019 0848 Last data filed at 08/18/2019 0800 Gross per 24 hour  Intake 793.18 ml  Output 1550 ml  Net -756.82 ml   Filed Weights   08/18/19 0500  Weight: 60.4 kg    Examination: General: Adult female, in NAD. Neuro: Unresponsive. Delayed withdraw in b/l le to painful stim. Pupils unequal and fixed HEENT: [place]/AT. Sclerae anicteric.  ETT in place. Cardiovascular: RRR, no M/R/G.  Lungs: Respirations even and unlabored.  CTA bilaterally, No W/R/R.  Abdomen: BS x 4, soft, NT/ND.  Musculoskeletal: No gross deformities, no edema.  Skin: Intact, warm, no rashes.  Assessment & Plan:   Large left SDH with uncal herniation and significant MLS - not a surgical candidate due to futility per neurosurgery. - Supportive care. - Continue keppra.   Hypertension. - Cleviprex PRN for goal SBP < 140. - Hold home hydralazine.  Respiratory insufficiency - 2/2 above. - Full vent support. - No weaning due to mental status. - Bronchial hygiene. - Follow CXR.  Hyponatremia - chronic. Hypokalemia. Hypocalcemia. - NS at 7ml/hr - 80 mEq K per tube.   EtOH abuse. -  Thiamine, folate.  Rest per primary.  Best Practice:  Diet: ok for tf Pain/Anxiety/Delirium protocol (if indicated): Fentanyl PRN / Midazolam PRN.  RASS goal 0 to -1. VAP protocol (if indicated): In place. DVT prophylaxis: SCD's only. GI prophylaxis: PPI. Glucose control: SSI if glucose consistently > 180. Mobility: Bedrest. Code Status: Full - neurosurgery attempting to contact family. Family Communication: Per primary. Disposition: ICU.  Labs   CBC: Recent Labs  Lab 08/15/19 0649  08/29/2019 1954 08/20/2019 1959 08/13/2019 2158  WBC 4.0 8.4  --   --   NEUTROABS 2.3 5.8  --   --   HGB 12.4 13.8 13.6 13.3  HCT 34.8* 38.2 40.0 39.0  MCV 95.3 96.0  --   --   PLT 214 325  --   --    Basic Metabolic Panel: Recent Labs  Lab 08/11/19 1627 08/11/19 1627 08/12/19 0354 08/12/19 0354 08/13/19 0456 08/13/19 0456 08/15/19 0649 08/16/19 0737 09/04/2019 1954 08/13/2019 1959 08/19/2019 2158  NA 128*   < > 128*   < > 129*  --  131*  --  127* 127* 125*  K 3.5   < > 4.6   < > 4.0   < > 3.3* 3.5 3.4* 3.4* 3.2*  CL 93*   < > 94*  --  96*  --  94*  --  93* 93*  --   CO2 21*  --  19*  --  21*  --  26  --  19*  --   --   GLUCOSE 99   < > 81  --  87  --  92  --  179* 175*  --   BUN 7*   < > 6*  --  5*  --  <5*  --  5* 3*  --   CREATININE 0.61   < > 0.54  --  0.55  --  0.50  --  0.52 0.30*  --   CALCIUM 8.7*  --  8.5*  --  8.8*  --  8.8*  --  8.6*  --   --   MG 1.7  --   --   --   --   --   --   --   --   --   --   PHOS 2.4*  --   --   --   --   --   --   --   --   --   --    < > = values in this interval not displayed.   GFR: Estimated Creatinine Clearance: 60.6 mL/min (A) (by C-G formula based on SCr of 0.3 mg/dL (L)). Recent Labs  Lab 08/15/19 0649 08/22/2019 1954  WBC 4.0 8.4   Liver Function Tests: Recent Labs  Lab 08/13/19 0456 08/15/19 0649 08/24/2019 1954  AST 36 40 37  ALT 76* 62* 54*  ALKPHOS 57 62 71  BILITOT 1.5* 1.3* 1.5*  PROT 5.7* 5.6* 6.3*  ALBUMIN 3.1* 3.1* 3.6   No results for input(s): LIPASE, AMYLASE in the last 168 hours. No results for input(s): AMMONIA in the last 168 hours. ABG    Component Value Date/Time   PHART 7.390 08/23/2019 2158   PCO2ART 36.4 08/22/2019 2158   PO2ART 175 (H) 08/11/2019 2158   HCO3 22.0 09/04/2019 2158   TCO2 23 09/01/2019 2158   ACIDBASEDEF 2.0 08/31/2019 2158   O2SAT 100.0 09/03/2019 2158    Coagulation Profile: Recent Labs  Lab 08/15/2019 1954  INR 1.0  Cardiac Enzymes: No results for input(s): CKTOTAL, CKMB,  CKMBINDEX, TROPONINI in the last 168 hours. HbA1C: Hgb A1c MFr Bld  Date/Time Value Ref Range Status  02/04/2012 05:10 PM 5.2 4.6 - 6.5 % Final    Comment:    Glycemic Control Guidelines for People with Diabetes:Non Diabetic:  <6%Goal of Therapy: <7%Additional Action Suggested:  >8%    CBG: Recent Labs  Lab 08/14/2019 1948  GLUCAP 164*     Critical care time: The patient is critically ill with multiple organ systems failure and requires high complexity decision making for assessment and support, frequent evaluation and titration of therapies, application of advanced monitoring technologies and extensive interpretation of multiple databases.  Critical care time 35 mins. This represents my time independent of the NPs time taking care of the pt. This is excluding procedures.    Briant Sites DO [facility] 08/18/2019, 8:48 AM

## 2019-08-18 NOTE — Care Management (Addendum)
Multiple attempts to reach patient's son have been unsuccessful.  Reached out to [place], [place] police department non-emergent line; information given regarding need to reach son to assist with goals of care.  [place] [employer] to attempt to make contact with son; will provide updates as they are available.   Addendum:  Notified by [place] [place] [facility] [employer] that son [facility] has been located; phone number [phone].  Number given to bedside nurse who will call to update son.  Quintella Baton, RN, BSN  Trauma/Neuro ICU Case Manager [phone]

## 2019-08-18 NOTE — Progress Notes (Signed)
Pt has a son who lives in [place] named Gearline Spilman, phone # [phone] was provided by her only contact person Liliane Shi.  No answer when this number is called, no voicemail.  TOC notified.

## 2019-08-18 NOTE — TOC Progression Note (Signed)
Transition of Care [facility]) - Progression Note    Patient Details  Name: JALEENA VIVIANI MRN: [id number] Date of Birth: 07-24-1950  Transition of Care [facility]) CM/SW Contact  Erin Sons, [place] Phone Number: 08/18/2019, 4:06 PM  Clinical Narrative:     CSW called pt son [facility]. Son explained he was unaware that his mother was in hospital until he was contacted by police department. Pt explains he has spoken with other family members and that he and his half sister are flying to [facility] on 08/19/19 to visit pt. CSW explained visitor policy and how pt could contact pt's floor and room number.        Expected Discharge Plan and Services                                                 Social Determinants of Health (SDOH) Interventions    Readmission Risk Interventions No flowsheet data found.

## 2019-08-18 NOTE — Progress Notes (Signed)
Patient ID: Olivia Harris, female   DOB: [birth year], 69 y.o.   MRN: [id number] I have spoken with Olivia Harris's son. He is going to speak with other family members prior to making a decision around terminating ventilatory support. I have explained to him that I do not believe Olivia Harris's injury is survivable

## 2019-08-18 NOTE — Progress Notes (Signed)
Initial Nutrition Assessment  DOCUMENTATION CODES:   Severe malnutrition in context of acute illness/injury  INTERVENTION:  Monitor for decision regarding goals of care. If it is decided to provide full support, initiate tube feeding via OG tube:  Vital AF 1.2 cal @ 54ml/hr, increase 32ml/hr Q4H until goal rate of 73ml/hr ( ) is reached 26ml Prostat daily  At goal, recommended tube feeding would provide 1540 kcal, 105 grams protein, free water  NUTRITION DIAGNOSIS:   Severe Malnutrition related to acute illness(SDH) as evidenced by moderate fat depletion, moderate muscle depletion.    GOAL:   Patient will meet greater than or equal to 90% of their needs    MONITOR:   Vent status, Weight trends, Labs, I & O's  REASON FOR ASSESSMENT:   Ventilator    ASSESSMENT:   Pt with large SDH with uncal herniation and significant MLS who is not a surgical candidate due to futility per neurosurgery.  5/13 OG placed, verified tip in stomach  Pt is noted to have minimal brain function. PCCM has recommended GOC/withdrawal discussion. Discussed pt with RN. GOC discussion to occur today.   Patient is currently intubated on ventilator support MV: 6.9 L/min Temp (24hrs), Avg:99.9 F (37.7 C), Min:97.5 F (36.4 C), Max:101.3 F (38.5 C)  UOP: 1,534ml x24 hours I/O: -357.68ml since admit  Labs: Na 125 (L), K+ 3.2 (L) Medications reviewed and include: colace, folic acid, miralax, KCL , thiamine   NUTRITION - FOCUSED PHYSICAL EXAM:    Most Recent Value  Orbital Region  No depletion  Upper Arm Region  Mild depletion  Thoracic and Lumbar Region  Moderate depletion  Buccal Region  Unable to assess  Temple Region  No depletion  Clavicle Bone Region  No depletion  Clavicle and Acromion Bone Region  Moderate depletion  Scapular Bone Region  Moderate depletion  Dorsal Hand  Moderate depletion  Patellar Region  Moderate depletion  Anterior Thigh Region  Severe  depletion  Posterior Calf Region  Moderate depletion  Edema (RD Assessment)  None  Hair  Reviewed  Eyes  Unable to assess  Mouth  Unable to assess  Skin  Reviewed  Nails  Reviewed       Diet Order:   Diet Order            Diet NPO time specified  Diet effective now              EDUCATION NEEDS:   Not appropriate for education at this time  Skin:  Skin Assessment: Reviewed RN Assessment  Last BM:  unknown  Height:   Ht Readings from Last 1 Encounters:  08-22-19 5\' 5"  (1.651 m)    Weight:   Wt Readings from Last 3 Encounters:  08/18/19 60.4 kg  07/19/19 68 kg  03/19/15 57.2 kg     BMI:  Body mass index is 22.16 kg/m.  Estimated Nutritional Needs:   Kcal:  1525  Protein:  90-105 grams  Fluid:  >1.5L/d   03/21/15, MS, RD, LDN RD pager number and weekend/on-call pager number located in Amion.

## 2019-08-19 DIAGNOSIS — E43 Unspecified severe protein-calorie malnutrition: Secondary | ICD-10-CM | POA: Insufficient documentation

## 2019-08-19 DIAGNOSIS — S065X9A Traumatic subdural hemorrhage with loss of consciousness of unspecified duration, initial encounter: Secondary | ICD-10-CM

## 2019-08-19 LAB — COMPREHENSIVE METABOLIC PANEL
ALT: 39 U/L (ref 0–44)
AST: 31 U/L (ref 15–41)
Albumin: 3.2 g/dL — ABNORMAL LOW (ref 3.5–5.0)
Alkaline Phosphatase: 79 U/L (ref 38–126)
Anion gap: 9 (ref 5–15)
BUN: <5 mg/dL — ABNORMAL LOW (ref 8–23)
CO2: 20 mmol/L — ABNORMAL LOW (ref 22–32)
Calcium: 9.2 mg/dL (ref 8.9–10.3)
Chloride: 109 mmol/L (ref 98–111)
Creatinine, Ser: 0.39 mg/dL — ABNORMAL LOW (ref 0.44–1.00)
GFR calc Af Amer: >60 mL/min (ref >60)
GFR calc non Af Amer: >60 mL/min (ref >60)
Glucose, Bld: 148 mg/dL — ABNORMAL HIGH (ref 70–99)
Potassium: 5.5 mmol/L — ABNORMAL HIGH (ref 3.5–5.1)
Sodium: 138 mmol/L (ref 135–145)
Total Bilirubin: 1.3 mg/dL — ABNORMAL HIGH (ref 0.3–1.2)
Total Protein: 6.5 g/dL (ref 6.5–8.1)

## 2019-08-19 LAB — CBC
HCT: 44.1 % (ref 36.0–46.0)
Hemoglobin: 15 g/dL (ref 12.0–15.0)
MCH: 34.1 pg — ABNORMAL HIGH (ref 26.0–34.0)
MCHC: 34 g/dL (ref 30.0–36.0)
MCV: 100.2 fL — ABNORMAL HIGH (ref 80.0–100.0)
Platelets: 310 10*3/uL (ref 150–400)
RBC: 4.4 MIL/uL (ref 3.87–5.11)
RDW: 13.9 % (ref 11.5–15.5)
WBC: 17.5 10*3/uL — ABNORMAL HIGH (ref 4.0–10.5)
nRBC: 0 % (ref 0.0–0.2)

## 2019-08-19 LAB — MAGNESIUM: Magnesium: 2.2 mg/dL (ref 1.7–2.4)

## 2019-08-19 LAB — PHOSPHORUS: Phosphorus: 3.2 mg/dL (ref 2.5–4.6)

## 2019-08-19 MED ORDER — SODIUM CHLORIDE 0.9 % IV SOLN
INTRAVENOUS | Status: DC
  Administered 2019-08-19: 975 mL via INTRAVENOUS

## 2019-08-19 MED ORDER — LABETALOL HCL 5 MG/ML IV SOLN
10.0000 mg | INTRAVENOUS | Status: DC | PRN
  Administered 2019-08-19 – 2019-08-21 (×3): 10 mg via INTRAVENOUS
  Filled 2019-08-19 (×3): qty 4

## 2019-08-19 NOTE — Progress Notes (Signed)
RT NOTES: Placed patient on pressure support per CDS. Patient went apneic, no patient effort at all. Placed back on full support at this time.

## 2019-08-19 NOTE — Progress Notes (Addendum)
2707: CDS updated on patient status.   1730: Spoke with Weston Brass from CDS, updated him on pt's status. States he doesn't think pt will be a candidate but will follow up later tonight.   Referral #: [id number]  Judeth Cornfield (coordinator) [phone]

## 2019-08-19 NOTE — Progress Notes (Signed)
   NAME:  Olivia Harris, MRN:  [id number], DOB:  [birth year], LOS: 2 ADMISSION DATE:  09/04/2019, CONSULTATION DATE:  21-Aug-2019 REFERRING MD:  Lovell Sheehan  CHIEF COMPLAINT:  AMS   Brief History   69 yo female with Lt SDH and herniation.  Required intubation for airway protection.  Past Medical History  Osteopenia, HTN, Depresion, ETOH, Hyponatremia,   Significant Hospital Events   5/13 admitted with large left SDH with uncal herniation and MLS.  Consults:  Neuro >> s/o 5/13  Procedures:  ETT 5/13 >   Significant Diagnostic Tests:  CT head 5/13 > large left SDH with 2cm MLS.  Associated small volume SAH.  Micro Data:  SARS CoV2 PCR 5/13 >>  Antimicrobials:    Interim history/subjective:  Remains on vent.  Objective:  Blood pressure 126/86, pulse (!) 103, temperature 99.5 F (37.5 C), resp. rate 16, height 5\' 5"  (1.651 m), weight 60.4 kg, SpO2 100 %.    Vent Mode: PRVC FiO2 (%):  [40 %] 40 % Set Rate:  [12 bmp] 12 bmp Vt Set:  [460 mL] 460 mL PEEP:  [5 cmH20] 5 cmH20 Plateau Pressure:  [10 cmH20-14 cmH20] 10 cmH20   Intake/Output Summary (Last 24 hours) at 08/19/2019 1120 Last data filed at 08/19/2019 1000 Gross per 24 hour  Intake 1915.07 ml  Output 300 ml  Net 1615.07 ml   Filed Weights   08/18/19 0500  Weight: 60.4 kg    Examination:  General - unresponsive Eyes - pupils non-reactive ENT - ETT in place Cardiac - regular rate/rhythm, no murmur Chest - equal breath sounds b/l, no wheezing or rales Abdomen - soft, non tender, + bowel sounds Extremities - no cyanosis, clubbing, or edema Skin - no rashes Neuro - not following commands  Assessment:   Large Lt SDH Uncal herniation Hypertension Compromised airway Chronic hyponatremia ETOH abuse  Plan:  - pt's son arriving later this evening - likely will plan for transition to comfort measures  Signature:  08/20/19, MD [facility] Pulmonary/Critical Care Pager - [phone] 08/19/2019, 11:26  AM

## 2019-08-20 LAB — COMPREHENSIVE METABOLIC PANEL
ALT: 25 U/L (ref 0–44)
AST: 21 U/L (ref 15–41)
Albumin: 2.5 g/dL — ABNORMAL LOW (ref 3.5–5.0)
Alkaline Phosphatase: 73 U/L (ref 38–126)
Anion gap: 9 (ref 5–15)
BUN: 5 mg/dL — ABNORMAL LOW (ref 8–23)
CO2: 21 mmol/L — ABNORMAL LOW (ref 22–32)
Calcium: 8.8 mg/dL — ABNORMAL LOW (ref 8.9–10.3)
Chloride: 109 mmol/L (ref 98–111)
Creatinine, Ser: 0.45 mg/dL (ref 0.44–1.00)
GFR calc Af Amer: >60 mL/min (ref >60)
GFR calc non Af Amer: >60 mL/min (ref >60)
Glucose, Bld: 119 mg/dL — ABNORMAL HIGH (ref 70–99)
Potassium: 4.3 mmol/L (ref 3.5–5.1)
Sodium: 139 mmol/L (ref 135–145)
Total Bilirubin: 0.8 mg/dL (ref 0.3–1.2)
Total Protein: 5.8 g/dL — ABNORMAL LOW (ref 6.5–8.1)

## 2019-08-20 LAB — CBC
HCT: 38.6 % (ref 36.0–46.0)
Hemoglobin: 12.9 g/dL (ref 12.0–15.0)
MCH: 34.1 pg — ABNORMAL HIGH (ref 26.0–34.0)
MCHC: 33.4 g/dL (ref 30.0–36.0)
MCV: 102.1 fL — ABNORMAL HIGH (ref 80.0–100.0)
Platelets: 259 10*3/uL (ref 150–400)
RBC: 3.78 MIL/uL — ABNORMAL LOW (ref 3.87–5.11)
RDW: 14 % (ref 11.5–15.5)
WBC: 13 10*3/uL — ABNORMAL HIGH (ref 4.0–10.5)
nRBC: 0 % (ref 0.0–0.2)

## 2019-08-20 LAB — PHOSPHORUS: Phosphorus: 4 mg/dL (ref 2.5–4.6)

## 2019-08-20 LAB — MAGNESIUM: Magnesium: 1.9 mg/dL (ref 1.7–2.4)

## 2019-08-20 NOTE — Progress Notes (Signed)
RT NOTES: Patient placed on pressure support 5/5 per CDS. Pt pulling volumes at 340cc with a respiratory rate of 6. Patient also checked for cuff leak per CDS and patient does NOT have cuff leak at this time.

## 2019-08-20 NOTE — Progress Notes (Signed)
Patient ID: Olivia Harris, female   DOB: 02/07/1951, 69 y.o.   MRN: [id number] We met with the patient's son and his half-sister.  We have had a long discussion regarding her current condition and prognosis.  She remains comatose with some mild decerebrate posturing with deep noxious stimuli.  Pupils are fixed and dilated.  Occasionally she tries to initiate of breath of the ventilator.  I explained that she has a very large left-sided dominant hemisphere subdural hematoma with severe mass-effect and shift and herniation syndrome with an almond shaped brainstem.  Prognosis is grim.  The decision for comfort care was made 3 days ago when it was decided that surgical intervention would be futile.  Have recommended extubation to comfort care.  The chaplain will be called.  Social work will be called.  We will try to answer all of their questions the best of our ability.  They have demonstrated understanding. 

## 2019-08-20 NOTE — Progress Notes (Signed)
Chaplain responded to request for family support prior to decision about withdrawal of care. Son, Olivia Harris, and Olivia Harris's half-sister (no relation to patient), are present.  Son appears emotionally overwhelmed and anxious about how fast things have happened, and that he hadn't been aware of pt's initial fall and hospitalization.  Son's half-sister, Olivia Harris, is older than Olivia Harris and appears to be a calm, emotionally-supportive presence. Son states that mother was not religious, had been Catholic, and that he is not religious, but spiritual/  Note:   Son does not appear to know about pt's possible suicidal ideation or that she had a gun.  He understands that alcohol has been a factor in his mother's decline.  Note:  Pt's ex-husband, Olivia Harris and Dana's father, committed suicide in 2017; there was no warning at all.  Family appears to have open grief from this loss.    Family asked to speak to a SW; Chaplain contacted Olivia Harris, who states he will visit and respond to questions, offer referrals as needed.  Chaplain provided refreshments, emotional and anticipatory grief support, and education about visitation and funeral services.    Son is currently contacting pt's significant other, Olivia Harris, and pt's friend, Olivia Harris, to ask if they want to come say goodbye. Son understands that he will need to communicate decision of withdrawal of care. He states he does not want to present at that name, nor does he was to return after mother passes.    Chaplain will remain available for f/u.  Olivia Harris [phone]    08/20/19 0900  Clinical Encounter Type  Visited With Patient and family together  Visit Type Initial;Critical Care  Referral From Nurse  Consult/Referral To Chaplain  Spiritual Encounters  Spiritual Needs Emotional;Grief support  Stress Factors  Patient Stress Factors Health changes  Family Stress Factors Family relationships;Major life changes

## 2019-08-20 NOTE — Progress Notes (Signed)
   NAME:  Olivia Harris, MRN:  [id number], DOB:  1951-02-08, LOS: 3 ADMISSION DATE:  08/28/2019, CONSULTATION DATE:  08/28/2019 REFERRING MD:  Lovell Sheehan  CHIEF COMPLAINT:  AMS   Brief History   69 yo female with Lt SDH and herniation.  Required intubation for airway protection.  Past Medical History  Osteopenia, HTN, Depresion, ETOH, Hyponatremia,   Significant Hospital Events   5/13 admitted with large left SDH with uncal herniation and MLS.  Consults:  Neuro >> s/o 5/13  Procedures:  ETT 5/13 >   Significant Diagnostic Tests:  CT head 5/13 > large left SDH with 2cm MLS.  Associated small volume SAH.  Micro Data:  SARS CoV2 PCR 5/13 >>  Antimicrobials:    Interim history/subjective:  No change.  Objective:  Blood pressure 138/77, pulse 98, temperature 98.4 F (36.9 C), resp. rate 17, height 5\' 5"  (1.651 m), weight 60.4 kg, SpO2 100 %.    Vent Mode: PRVC FiO2 (%):  [40 %] 40 % Set Rate:  [12 bmp] 12 bmp Vt Set:  [460 mL] 460 mL PEEP:  [5 cmH20] 5 cmH20 Plateau Pressure:  [10 cmH20-14 cmH20] 13 cmH20   Intake/Output Summary (Last 24 hours) at 08/20/2019 0803 Last data filed at 08/20/2019 0600 Gross per 24 hour  Intake 785.18 ml  Output 500 ml  Net 285.18 ml   Filed Weights   08/18/19 0500  Weight: 60.4 kg    Examination:  General - unresponsive Eyes - pupils non reactive ENT - ETT in place Cardiac - regular rate/rhythm, no murmur Chest - equal breath sounds b/l, no wheezing or rales Abdomen - soft, non tender, + bowel sounds Extremities - no cyanosis, clubbing, or edema Skin - no rashes Neuro - no response to stimuli   Assessment:  Large Lt SDH Uncal herniation Hypertension Compromised airway Chronic hyponatremia ETOH abuse  Plan:  - continue vent support pending family decision regarding goals of care  Signature:  08/20/19, MD [facility] Pulmonary/Critical Care Pager - [phone] 08/20/2019, 8:03 AM

## 2019-08-20 NOTE — Progress Notes (Signed)
Son and son's half sister have requested information about staying at [facility].  Chaplain informed family of this possible resource, though no promises made, since decisions are handled during business hours.  Chaplain has referred this request to the morning chaplain.

## 2019-08-21 DIAGNOSIS — R40243 Glasgow coma scale score 3-8, unspecified time: Secondary | ICD-10-CM

## 2019-08-21 DIAGNOSIS — R402431 Glasgow coma scale score 3-8, in the field [EMT or ambulance]: Secondary | ICD-10-CM

## 2019-08-21 DIAGNOSIS — Z515 Encounter for palliative care: Secondary | ICD-10-CM

## 2019-08-21 LAB — CBC
HCT: 36.1 % (ref 36.0–46.0)
Hemoglobin: 12.3 g/dL (ref 12.0–15.0)
MCH: 34.3 pg — ABNORMAL HIGH (ref 26.0–34.0)
MCHC: 34.1 g/dL (ref 30.0–36.0)
MCV: 100.6 fL — ABNORMAL HIGH (ref 80.0–100.0)
Platelets: 225 10*3/uL (ref 150–400)
RBC: 3.59 MIL/uL — ABNORMAL LOW (ref 3.87–5.11)
RDW: 13.9 % (ref 11.5–15.5)
WBC: 8.7 10*3/uL (ref 4.0–10.5)
nRBC: 0 % (ref 0.0–0.2)

## 2019-08-21 LAB — COMPREHENSIVE METABOLIC PANEL
ALT: 24 U/L (ref 0–44)
AST: 25 U/L (ref 15–41)
Albumin: 2.2 g/dL — ABNORMAL LOW (ref 3.5–5.0)
Alkaline Phosphatase: 95 U/L (ref 38–126)
Anion gap: 9 (ref 5–15)
BUN: 6 mg/dL — ABNORMAL LOW (ref 8–23)
CO2: 20 mmol/L — ABNORMAL LOW (ref 22–32)
Calcium: 8.2 mg/dL — ABNORMAL LOW (ref 8.9–10.3)
Chloride: 109 mmol/L (ref 98–111)
Creatinine, Ser: 0.48 mg/dL (ref 0.44–1.00)
GFR calc Af Amer: >60 mL/min (ref >60)
GFR calc non Af Amer: >60 mL/min (ref >60)
Glucose, Bld: 131 mg/dL — ABNORMAL HIGH (ref 70–99)
Potassium: 3.8 mmol/L (ref 3.5–5.1)
Sodium: 138 mmol/L (ref 135–145)
Total Bilirubin: 1.6 mg/dL — ABNORMAL HIGH (ref 0.3–1.2)
Total Protein: 5.1 g/dL — ABNORMAL LOW (ref 6.5–8.1)

## 2019-08-21 LAB — MAGNESIUM: Magnesium: 1.8 mg/dL (ref 1.7–2.4)

## 2019-08-21 LAB — PHOSPHORUS: Phosphorus: 4.2 mg/dL (ref 2.5–4.6)

## 2019-08-21 MED ORDER — GLYCOPYRROLATE 0.2 MG/ML IJ SOLN: 0.2000 mg | INTRAMUSCULAR | Status: DC | PRN

## 2019-08-21 MED ORDER — DIPHENHYDRAMINE HCL 50 MG/ML IJ SOLN: 25.0000 mg | INTRAMUSCULAR | Status: DC | PRN

## 2019-08-21 MED ORDER — ACETAMINOPHEN 160 MG/5ML PO SOLN
650.0000 mg | ORAL | Status: DC | PRN
  Administered 2019-08-21: 650 mg
  Filled 2019-08-21: qty 20.3

## 2019-08-21 MED ORDER — GLYCOPYRROLATE 1 MG PO TABS
1.0000 mg | ORAL_TABLET | ORAL | Status: DC | PRN
  Filled 2019-08-21: qty 1

## 2019-08-21 MED ORDER — DEXTROSE 5 % IV SOLN: INTRAVENOUS | Status: DC

## 2019-08-21 MED ORDER — ACETAMINOPHEN 650 MG RE SUPP: 650.0000 mg | Freq: Four times a day (QID) | RECTAL | Status: DC | PRN

## 2019-08-21 MED ORDER — MORPHINE 100MG IN NS 100ML (1MG/ML) PREMIX INFUSION
5.0000 mg/h | INTRAVENOUS | Status: DC
  Administered 2019-08-21: 5 mg/h via INTRAVENOUS

## 2019-08-21 MED ORDER — ACETAMINOPHEN 325 MG PO TABS: 650.0000 mg | ORAL_TABLET | Freq: Four times a day (QID) | ORAL | Status: DC | PRN

## 2019-08-21 MED ORDER — POLYVINYL ALCOHOL 1.4 % OP SOLN
1.0000 [drp] | Freq: Four times a day (QID) | OPHTHALMIC | Status: DC | PRN
  Filled 2019-08-21: qty 15

## 2019-09-05 NOTE — Progress Notes (Signed)
   NAME:  Olivia Harris, MRN:  [id number], DOB:  09-29-1950, LOS: 4 ADMISSION DATE:  09/03/2019, CONSULTATION DATE:  08/12/2019 REFERRING MD:  Lovell Sheehan  CHIEF COMPLAINT:  AMS   Brief History   69 yo female with Lt SDH and herniation.  Required intubation for airway protection.  Past Medical History  Osteopenia, HTN, Depresion, ETOH, Hyponatremia  Significant Hospital Events   5/13 admitted with large left SDH with uncal herniation and MLS.  Consults:  Neuro >> s/o 5/13  Procedures:  ETT 5/13 >   Significant Diagnostic Tests:  CT head 5/13 > large left SDH with 2cm MLS.  Associated small volume SAH.  Micro Data:  SARS CoV2 PCR 5/13 >>neg   Antimicrobials:    Interim history/subjective:  Remains unresponsive  Objective:  Blood pressure 113/74, pulse 88, temperature (Abnormal) 100.4 F (38 C), resp. rate 13, height 5\' 5"  (1.651 m), weight 60.9 kg, SpO2 100 %.    Vent Mode: PSV;CPAP FiO2 (%):  [30 %-40 %] 40 % Set Rate:  [12 bmp] 12 bmp Vt Set:  [460 mL] 460 mL PEEP:  [5 cmH20] 5 cmH20 Plateau Pressure:  [12 cmH20-17 cmH20] 13 cmH20   Intake/Output Summary (Last 24 hours) at 08/23/2019 1116 Last data filed at 09/04/2019 1000 Gross per 24 hour  Intake 1282.48 ml  Output 1075 ml  Net 207.48 ml   Filed Weights   08/18/19 0500 08/11/2019 0500  Weight: 60.4 kg 60.9 kg    Examination:  General 69 year old female remains on full ventilator support HEENT normocephalic atraumatic no jugular venous distention appreciated Pulmonary clear to auscultation diminished bases Cardiac regular rate and rhythm Abdomen soft not tender there is no organomegaly Extremities are warm and dry Neuro GCS 3   Assessment:   Large Lt SDH Uncal herniation Hypertension Compromised airway Chronic hyponatremia ETOH abuse   Discussion   Devastating SDH. Family have decided on extubation  Plan:  Cont supportive care Cont full vent support for now Start morphine drip When family ready  we will proceed w/ extubation  Signature:  73 ACNP-BC [facility] Pulmonary/Critical Care Pager # [phone] OR # [phone] if no answer

## 2019-09-05 NOTE — Progress Notes (Signed)
Nutrition Brief Note  Chart reviewed. Pt now transitioning to comfort care.  No further nutrition interventions planned at this time.  Please re-consult as needed.   Oberia Beaudoin P., RD, LDN, CNSC See AMiON for contact information    

## 2019-09-05 NOTE — Procedures (Signed)
Extubation Procedure Note  Patient Details:   Name: Olivia Harris DOB: 28-Jul-1950 MRN: [id number]    Pt full comfort care & extubated at this time. Family at bedside.   Cherylin Mylar 08/20/2019, 4:34 PM

## 2019-09-05 NOTE — Progress Notes (Signed)
65cc morphine wasted in stericycle with charge RN, Lynnell Dike

## 2019-09-05 NOTE — Progress Notes (Signed)
eLink Physician-Brief Progress Note Patient Name: Olivia Harris DOB: 03-Apr-1951 MRN: [id number]   Date of Service  08/30/2019  HPI/Events of Note  Notified of temp 101.3 requesting for prn tylenol. On review of chart noted plans for comfort care  eICU Interventions  Tylenol prn ordered     Intervention Category Minor Interventions: Routine modifications to care plan (e.g. PRN medications for pain, fever)  Irving Burton T Fannye Myer 08/16/2019, 12:08 AM

## 2019-09-05 DEATH — deceased

## 2019-10-05 NOTE — Discharge Summary (Signed)
Physician Discharge Summary  Patient ID: Olivia Harris MRN: [id number] DOB/AGE: 68-Jun-1952 68 y.o.  Admit date: 08/19/2019 Discharge date: 09/22/2019  Admission Diagnoses:subdural hematoma  Discharge Diagnoses: same Active Problems:   Hypertension   Subdural hematoma (HCC)   Respiratory failure with hypoxia (HCC)   Acute encephalopathy   Protein-calorie malnutrition, severe   Glasgow coma scale total score 3-8 [facility])   Palliative care status   Discharged Condition: deceased  Hospital Course: Olivia Harris is a 69 y.o. female Whom had been admitted with a subdural hematoma on 08/11/2019, and discharged awake and alert on 08/16/19. She was readmitted on 5/13 with obtundation which was new. A new CT showed a large acute subdural hematoma with uncal herniation. She was comatose, and my partner felt that any operative intervention was futile. I saw her on 5/14, and there was no neurological change. I spoke with her son, and comfort care measures were instituted on 5/17. She expired shortly after extubation.   Treatments: IV hydration  Discharge Exam: Blood pressure 112/74, pulse 90, temperature (!) 100.7 F (38.2 C), resp. rate 12, height 5\' 5"  (1.651 m), weight 60.9 kg, SpO2 98 %. deceased  Disposition:  There are no questions and answers to display.       * No surgery found *  Allergies as of 09/03/2019      Reactions   Antihistamines, Chlorpheniramine-type Other (See Comments)   hyperactivity      Medication List    ASK your doctor about these medications   folic acid 1 MG tablet Commonly known as: FOLVITE Take 1 tablet (1 mg total) by mouth daily.   hydrALAZINE 10 MG tablet Commonly known as: APRESOLINE Take 1 tablet (10 mg total) by mouth 4 (four) times daily.   levETIRAcetam 500 MG tablet Commonly known as: KEPPRA Take 1 tablet (500 mg total) by mouth 2 (two) times daily for 1 day.   multivitamin with minerals Tabs tablet Take 1 tablet by mouth  daily.   pantoprazole 40 MG tablet Commonly known as: PROTONIX Take 1 tablet (40 mg total) by mouth at bedtime.   thiamine 100 MG tablet Take 1 tablet (100 mg total) by mouth daily.        Signed: 08/23/2019 09/22/2019, 5:22 PM

## 2021-06-14 IMAGING — CT CT HEAD W/O CM
3 of 4 series · 14 of 47 positions shown, 16 images · non-contrast
Comparison: None.

CLINICAL DATA: Confusion, altered mental status

EXAM:
CT HEAD WITHOUT CONTRAST
TECHNIQUE: Contiguous axial images were obtained from the base of the skull
through the vertex without intravenous contrast.

[Series 2: head wo · axial · 0.46mm/px · z∈[-174,-44]mm · 8 of 32 slices shown, 10 images]
[im 3/32  brain]
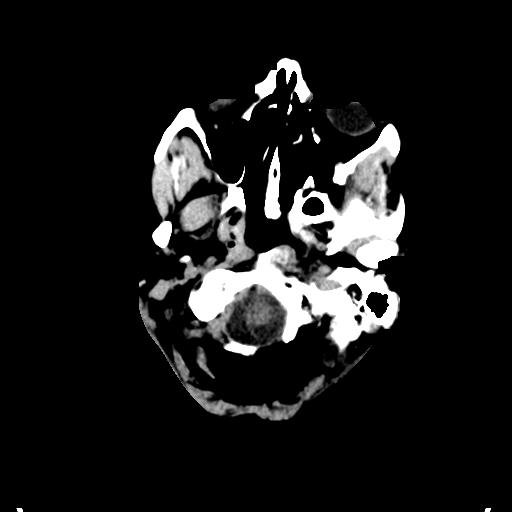
[im 3/32  bone]
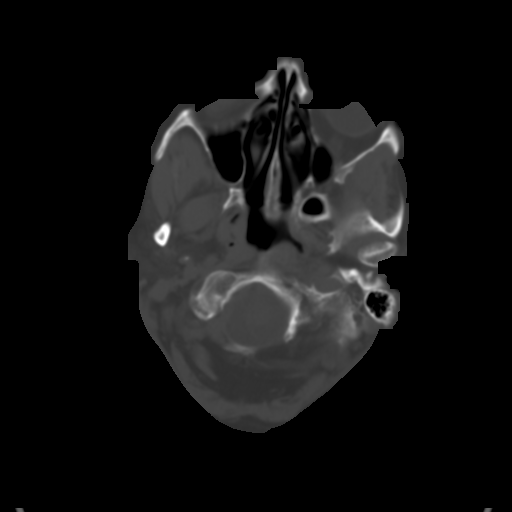
[im 7/32  brain]
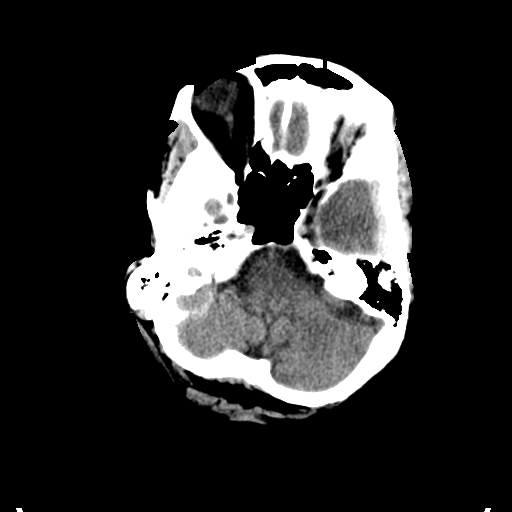
[im 12/32  brain]
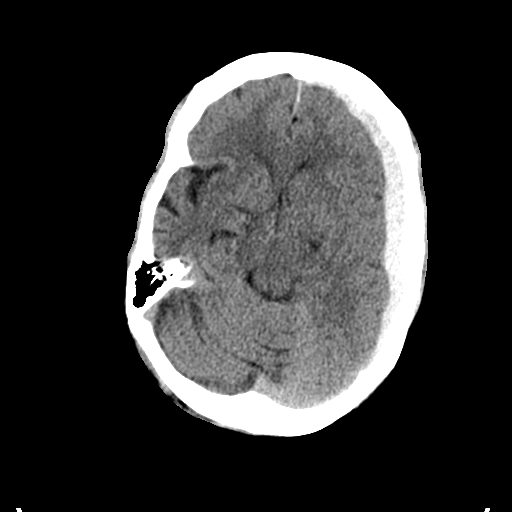
[im 14/32  brain]
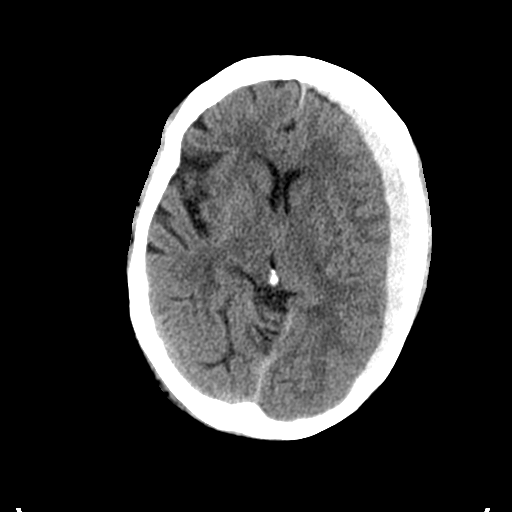
[im 18/32  brain]
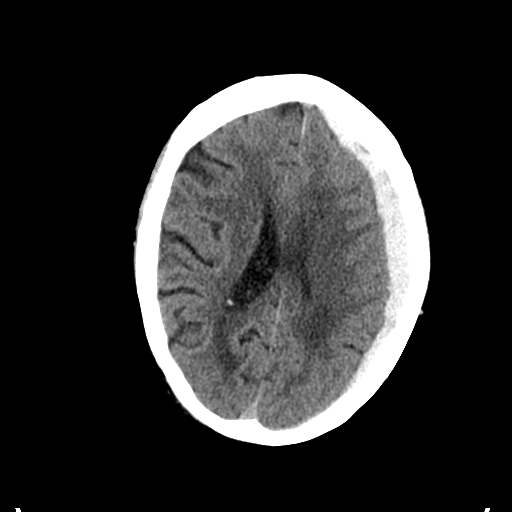
[im 18/32  bone]
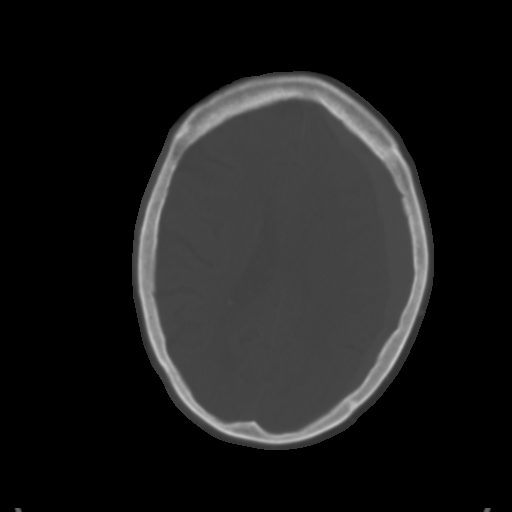
[im 20/32  brain]
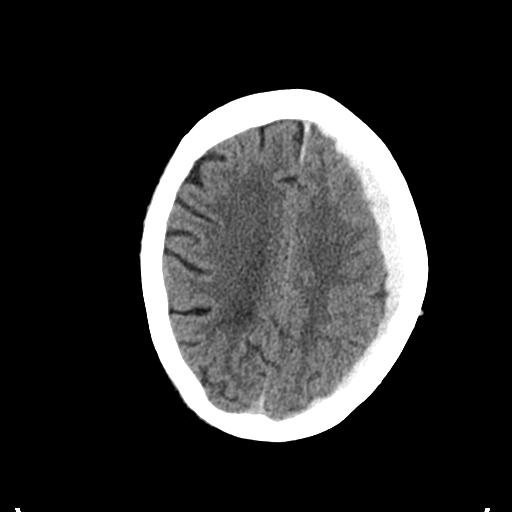
[im 25/32  brain]
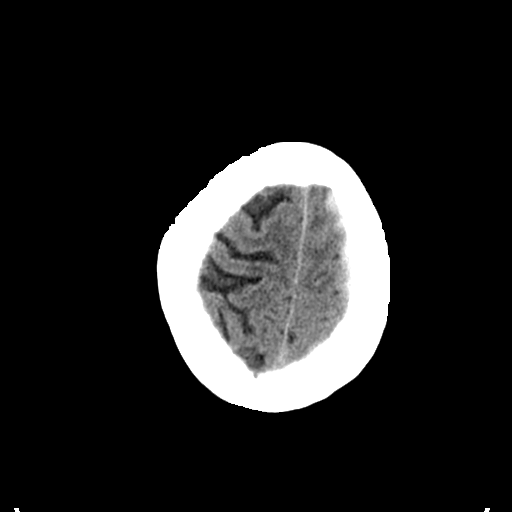
[im 29/32  brain]
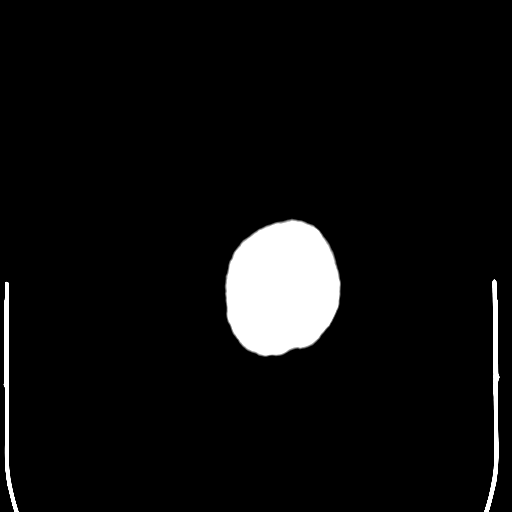

[Series 5: coronal soft tissue · coronal · 0.33mm/px · 3 of 61 slices shown]
[im 21/61  brain]
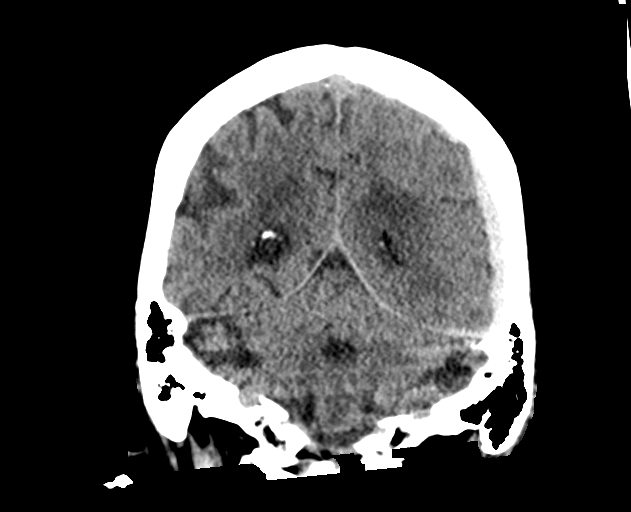
[im 27/61  brain]
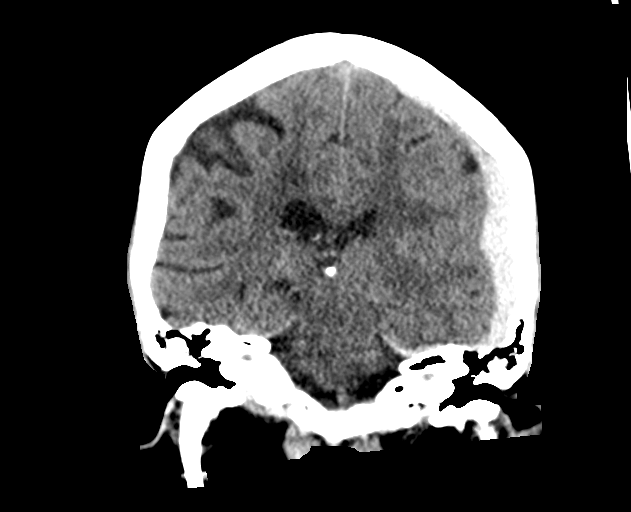
[im 34/61  brain]
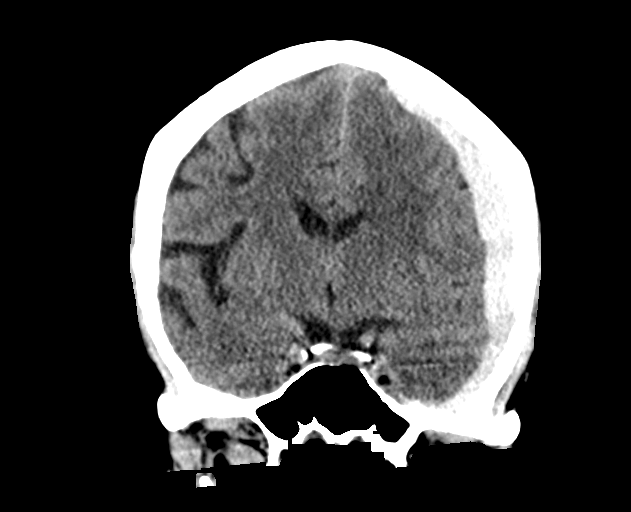

[Series 6: sagittal soft tissue · sagittal · 0.32mm/px · 3 of 49 slices shown]
[im 17/49  brain]
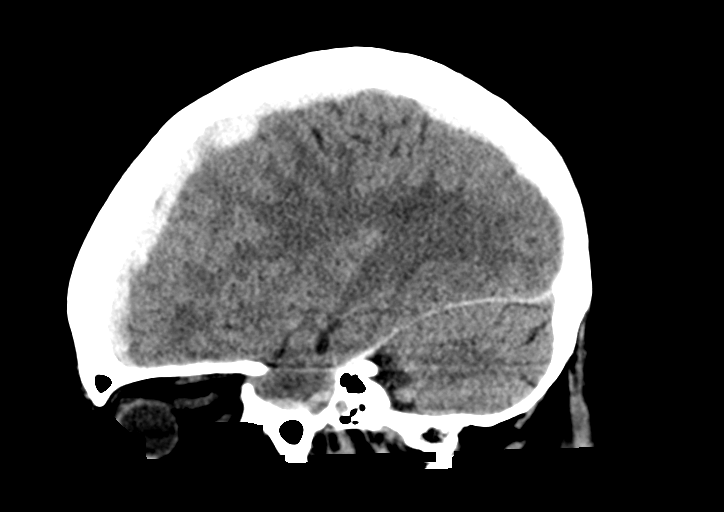
[im 25/49  brain]
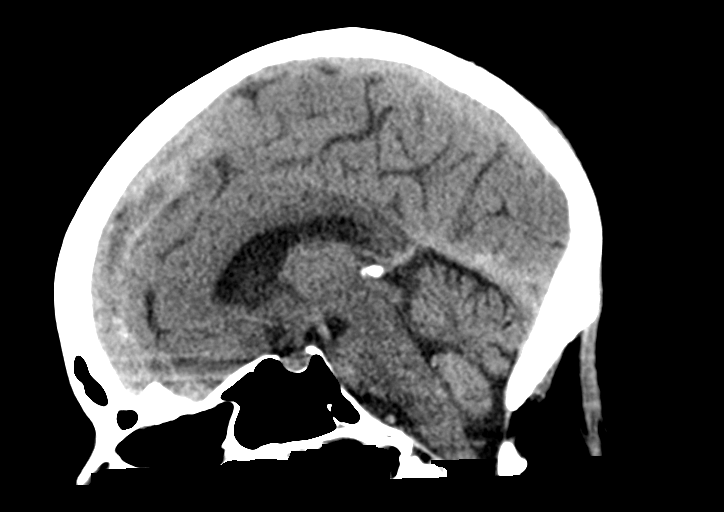
[im 33/49  brain]
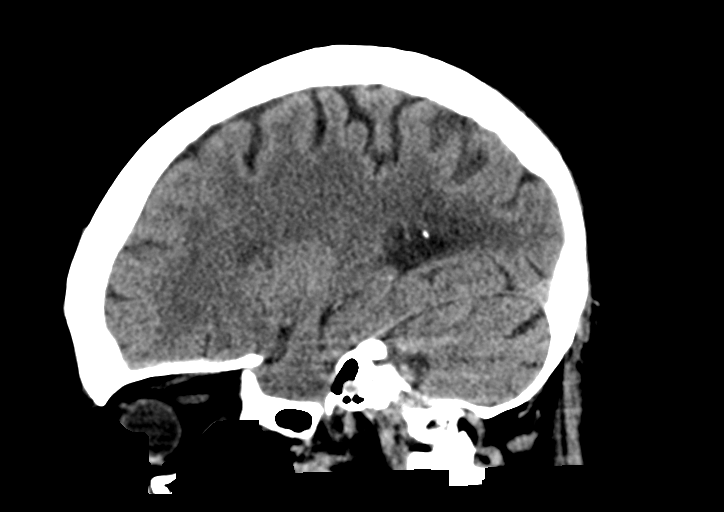

[14 of 47 positions shown; findings below may reference images not displayed]

FINDINGS: Brain: Crescentic, hyperdense subdural hemorrhage seen extending
across the left frontal and parietal convexities with additional
hyperdense hemorrhage seen layering along the left tentorium and
along the left falx as well. There is fairly global sulcal
effacement of the left cerebral hemisphere with some a minimal
left-to-right midline shift approximately 4 mm best seen on coronal
imaging [DATE]. No other sites of hemorrhage. No CT evidence of large
vascular territory infarct. Patchy areas of white matter
hypoattenuation are most compatible with chronic microvascular
angiopathy. Symmetric prominence of the ventricles, cisterns and
sulci compatible with parenchymal volume loss.

Vascular: No hyperdense vessel.

Skull: No calvarial fracture or suspicious osseous lesion. No scalp
swelling or hematoma.

Sinuses/Orbits: Paranasal sinuses and mastoid air cells are
predominantly clear. Pneumatization of the petrous apices. Included
orbital structures are unremarkable.

Other: None
IMPRESSION: 1. Crescentic, hyperdense subdural hemorrhage extending across the
cerebral hemisphere most pronounced over the left frontal and
temporal lobes, along the falx and left tentorium.
2. Mild mass effect with global sulcal effacement and left-to-right
midline shift of 4 mm.
3. No CT evidence of large vascular territory infarct.
4. Chronic microvascular angiopathy and parenchymal volume loss.

Critical Value/emergent results were called by telephone at the time
of interpretation on 08/10/2019 at [DATE] to provider KAZKI MIDORI
, who verbally acknowledged these results.
